# Patient Record
Sex: Female | Born: 1980
Health system: Midwestern US, Community
[De-identification: ages and names within clinical notes are randomized; demographics above are authoritative.]

## PROBLEM LIST (undated history)

## (undated) DIAGNOSIS — F32A Depression, unspecified: Secondary | ICD-10-CM

## (undated) DIAGNOSIS — G4730 Sleep apnea, unspecified: Secondary | ICD-10-CM

## (undated) DIAGNOSIS — F419 Anxiety disorder, unspecified: Secondary | ICD-10-CM

## (undated) DIAGNOSIS — F329 Major depressive disorder, single episode, unspecified: Secondary | ICD-10-CM

## (undated) DIAGNOSIS — G47411 Narcolepsy with cataplexy: Secondary | ICD-10-CM

## (undated) HISTORY — PX: BREAST SURGERY: SHX581

## (undated) HISTORY — PX: GASTRIC BYPASS: SHX52

## (undated) HISTORY — PX: OTHER SURGICAL HISTORY: SHX169

## (undated) HISTORY — PX: PILONIDAL CYST EXCISION: SHX744

## (undated) HISTORY — PX: ADENOIDECTOMY: SUR15

## (undated) HISTORY — PX: DILATION AND CURETTAGE, DIAGNOSTIC / THERAPEUTIC: SUR384

---

## 2001-04-09 ENCOUNTER — Encounter: Payer: Self-pay | Admitting: Obstetrics and Gynecology

## 2001-04-09 ENCOUNTER — Ambulatory Visit (HOSPITAL_COMMUNITY): Admission: RE | Admit: 2001-04-09 | Discharge: 2001-04-09 | Payer: Self-pay | Admitting: Internal Medicine

## 2002-06-25 ENCOUNTER — Other Ambulatory Visit: Admission: RE | Admit: 2002-06-25 | Discharge: 2002-06-25 | Payer: Self-pay | Admitting: Gynecology

## 2002-12-12 ENCOUNTER — Other Ambulatory Visit: Admission: RE | Admit: 2002-12-12 | Discharge: 2002-12-12 | Payer: Self-pay | Admitting: Gynecology

## 2003-04-17 ENCOUNTER — Other Ambulatory Visit: Admission: RE | Admit: 2003-04-17 | Discharge: 2003-04-17 | Payer: Self-pay | Admitting: Gynecology

## 2003-09-11 ENCOUNTER — Ambulatory Visit (HOSPITAL_COMMUNITY): Admission: RE | Admit: 2003-09-11 | Discharge: 2003-09-11 | Payer: Self-pay | Admitting: Family Medicine

## 2003-09-16 ENCOUNTER — Other Ambulatory Visit: Admission: RE | Admit: 2003-09-16 | Discharge: 2003-09-16 | Payer: Self-pay | Admitting: Gynecology

## 2004-02-03 ENCOUNTER — Other Ambulatory Visit: Admission: RE | Admit: 2004-02-03 | Discharge: 2004-02-03 | Payer: Self-pay | Admitting: Gynecology

## 2004-08-23 ENCOUNTER — Other Ambulatory Visit: Admission: RE | Admit: 2004-08-23 | Discharge: 2004-08-23 | Payer: Self-pay | Admitting: Gynecology

## 2005-01-16 ENCOUNTER — Ambulatory Visit (HOSPITAL_BASED_OUTPATIENT_CLINIC_OR_DEPARTMENT_OTHER): Admission: RE | Admit: 2005-01-16 | Discharge: 2005-01-16 | Payer: Self-pay | Admitting: Family Medicine

## 2005-01-23 ENCOUNTER — Ambulatory Visit: Payer: Self-pay | Admitting: Internal Medicine

## 2005-02-09 ENCOUNTER — Other Ambulatory Visit: Admission: RE | Admit: 2005-02-09 | Discharge: 2005-02-09 | Payer: Self-pay | Admitting: Gynecology

## 2006-02-13 ENCOUNTER — Other Ambulatory Visit: Admission: RE | Admit: 2006-02-13 | Discharge: 2006-02-13 | Payer: Self-pay | Admitting: Gynecology

## 2007-03-05 ENCOUNTER — Other Ambulatory Visit: Admission: RE | Admit: 2007-03-05 | Discharge: 2007-03-05 | Payer: Self-pay | Admitting: Gynecology

## 2007-06-21 ENCOUNTER — Ambulatory Visit (HOSPITAL_COMMUNITY): Admission: RE | Admit: 2007-06-21 | Discharge: 2007-06-21 | Payer: Self-pay | Admitting: Gynecology

## 2007-07-30 ENCOUNTER — Encounter (INDEPENDENT_AMBULATORY_CARE_PROVIDER_SITE_OTHER): Payer: Self-pay | Admitting: *Deleted

## 2007-07-30 ENCOUNTER — Ambulatory Visit (HOSPITAL_COMMUNITY): Admission: AD | Admit: 2007-07-30 | Discharge: 2007-07-30 | Payer: Self-pay | Admitting: *Deleted

## 2008-02-25 ENCOUNTER — Other Ambulatory Visit: Admission: RE | Admit: 2008-02-25 | Discharge: 2008-02-25 | Payer: Self-pay | Admitting: Gynecology

## 2008-05-22 ENCOUNTER — Emergency Department (HOSPITAL_COMMUNITY): Admission: EM | Admit: 2008-05-22 | Discharge: 2008-05-23 | Payer: Self-pay | Admitting: Emergency Medicine

## 2009-08-31 ENCOUNTER — Ambulatory Visit (HOSPITAL_BASED_OUTPATIENT_CLINIC_OR_DEPARTMENT_OTHER): Admission: RE | Admit: 2009-08-31 | Discharge: 2009-08-31 | Payer: Self-pay | Admitting: General Surgery

## 2010-02-12 ENCOUNTER — Emergency Department (HOSPITAL_COMMUNITY): Admission: EM | Admit: 2010-02-12 | Discharge: 2010-02-12 | Payer: Self-pay | Admitting: Emergency Medicine

## 2010-04-02 ENCOUNTER — Emergency Department (HOSPITAL_COMMUNITY): Admission: EM | Admit: 2010-04-02 | Discharge: 2010-04-02 | Payer: Self-pay | Admitting: Emergency Medicine

## 2010-04-22 ENCOUNTER — Ambulatory Visit: Payer: Self-pay | Admitting: Internal Medicine

## 2010-04-23 ENCOUNTER — Telehealth: Payer: Self-pay | Admitting: Internal Medicine

## 2010-06-03 ENCOUNTER — Ambulatory Visit: Admit: 2010-06-03 | Payer: Self-pay | Admitting: Internal Medicine

## 2010-06-15 NOTE — Progress Notes (Signed)
Summary: nos appt  Phone Note Call from Patient   Caller: [email or web address]  Call For: young Summary of Call: LMTCB x2 to rsc nos from 12/8. Initial call taken by: Darletta Moll,  April 23, 2010 2:59 PM

## 2010-06-16 ENCOUNTER — Other Ambulatory Visit: Payer: Self-pay | Admitting: Gynecology

## 2010-07-29 LAB — CULTURE, ROUTINE-ABSCESS

## 2010-08-03 LAB — WOUND CULTURE

## 2010-08-30 LAB — CULTURE, ROUTINE-ABSCESS

## 2010-08-30 LAB — URINE MICROSCOPIC-ADD ON

## 2010-08-30 LAB — URINALYSIS, ROUTINE W REFLEX MICROSCOPIC
Hgb urine dipstick: NEGATIVE
Leukocytes, UA: NEGATIVE
Protein, ur: 30 mg/dL — AB
Specific Gravity, Urine: 1.043 — ABNORMAL HIGH (ref 1.005–1.030)
Urobilinogen, UA: 0.2 mg/dL (ref 0.0–1.0)

## 2010-08-30 LAB — CBC
Platelets: 290 10*3/uL (ref 150–400)
RDW: 13.3 % (ref 11.5–15.5)
WBC: 10.9 10*3/uL — ABNORMAL HIGH (ref 4.0–10.5)

## 2010-08-30 LAB — POCT I-STAT, CHEM 8
BUN: 15 mg/dL (ref 6–23)
Creatinine, Ser: 0.9 mg/dL (ref 0.4–1.2)
Hemoglobin: 12.9 g/dL (ref 12.0–15.0)
Potassium: 3.4 mEq/L — ABNORMAL LOW (ref 3.5–5.1)
Sodium: 140 mEq/L (ref 135–145)
TCO2: 29 mmol/L (ref 0–100)

## 2010-08-30 LAB — DIFFERENTIAL
Basophils Absolute: 0 10*3/uL (ref 0.0–0.1)
Eosinophils Absolute: 0.2 10*3/uL (ref 0.0–0.7)
Lymphocytes Relative: 29 % (ref 12–46)
Lymphs Abs: 3.1 10*3/uL (ref 0.7–4.0)
Neutrophils Relative %: 63 % (ref 43–77)

## 2010-09-28 NOTE — Op Note (Signed)
Shari Kennedy, Shari Kennedy                ACCOUNT NO.:  [id number]   MEDICAL RECORD NO.:  [id number]          PATIENT TYPE:  MAT   LOCATION:  [facility]                          FACILITY:  [facility]   PHYSICIAN:  Almedia Balls. Fore, M.D.   DATE OF BIRTH:  1980/10/03   DATE OF PROCEDURE:  05/15/2008  DATE OF DISCHARGE:                               OPERATIVE REPORT   PREOPERATIVE DIAGNOSIS:  Status post therapeutic abortion, continued  bleeding, with retained products of conception.   POSTOPERATIVE DIAGNOSIS:  Status post therapeutic abortion, continued  bleeding, with retained products of conception,  pending pathology.   OPERATION:  Suction D&E and hysteroscopy.   ANESTHESIA:  General orotracheal plus 10 mL 1% lidocaine paracervical  block.   OPERATOR:  Almedia Balls. Randell Patient, M.D.   INDICATIONS FOR SURGERY:  The patient is a 30 year old who underwent a  therapeutic abortion in [place], [place] in December 2008.  She  has had persistent bleeding since that time and has been followed by Dr.  Chevis Pretty since February 2009.  She had retained products of conception  which did not respond to medical treatment.  She experienced heavy  bleeding beginning on July 30, 2007, and presented at the office, where  her hemoglobin was 11.3.  Because of the extreme amount of bleeding at  this point, it was felt that repeat D&E was indicated.  She was fully  counseled as to the nature of the procedure and the risks involved to  include risks of anesthesia, injury to uterus, tubes, ovaries, bowel,  bladder, blood vessels, ureters, postoperative hemorrhage, infection,  and recuperation.  She fully understands all these considerations and  has signed informed consent to proceed on July 30, 2007.   OPERATIVE FINDINGS:  On bimanual exam, the uterus was approximately 6-[redacted]  weeks gestational size.  There were no palpable adnexal masses.  On  hysteroscopy, the uterus sounded to 9.5 cm.  The endocervical canal was  clean.   Endometrial cavity had a mass of tissue in the upper left fundal  area.   PROCEDURE:  With the patient under general anesthesia, prepared and  draped in usual sterile fashion, a speculum was placed in the vagina.  A  solution of 1% lidocaine was injected at the 2, 4, 8, and 10 o'clock  positions for a total of 10 mL for paracervical block.  Single-tooth  tenaculum was placed at the 12 o'clock position, and the cervix was  dilated up to a #27 Pratt dilator.  A #9 curved suction Vacurette was  used for evacuation of the tissue mass within the uterus.  Hysteroscope  was re-employed to ensure that all tissue had been removed, and after  noting this was the case, a small sharp curette was used for gentle  curetting of the endometrium to ensure that all small amounts of tissue  had been removed.  Suction Vacurette was re-employed to ensure that all  tissue had been removed.  After noting that this was the case and that  sponge and instrument counts were correct and hemostasis was maintained,  the procedure was terminated.  ESTIMATED BLOOD LOSS:  100 mL.   The patient was taken to the recovery room in good condition.   FOLLOW-UP CARE:  She is to return the office in approximately 2 weeks  for followup and to call if heavy bleeding, pain, or unexplained fever  should ensue.  She was instructed to avoid any vaginal penetrance for 2  weeks.  She was given prescriptions for Darvocet-N 100 generic, #10 to  be taken 1/2-1 q.6 h. p.r.n. pain; ampicillin 500 mg, #24, to be taken 4  stat, 1 q.i.d.; and Methergine 0.2 mg, #8, to be taken 1 q.i.d.           ______________________________  Almedia Balls. Randell Patient, M.D.     SRF/MEDQ  D:  07/30/2007  T:  07/31/2007  Job:  [id number]   cc:   Leatha Gilding. Mezer, M.D.  Fax: [phone]

## 2010-10-01 NOTE — Procedures (Signed)
Shari Kennedy, Shari Kennedy                ACCOUNT NO.:  [id number]   MEDICAL RECORD NO.:  [id number]          PATIENT TYPE:  OUT   LOCATION:  SLEEP CENTER                 FACILITY:  [facility]   PHYSICIAN:  Clinton D. Maple Hudson, M.D. DATE OF BIRTH:  [birth year]   DATE OF STUDY:  01/16/2005                              NOCTURNAL POLYSOMNOGRAM   REFERRING PHYSICIAN:  Dr. Selinda Flavin.   DATE OF STUDY:  January 16, 2005.   INDICATION FOR STUDY:  Hypersomnia with sleep apnea. Epworth sleepiness  score of 16/24, BMI 31, weight 178 pounds.   SLEEP ARCHITECTURE:  Total sleep time 377 minutes with sleep efficiency 87%.  Stage I 13%, stage II 81%, stages III and IV absent, REM 6% of total sleep  time. Sleep latency 14 minutes, REM latency 396 minutes, awake after sleep  onset 44 minutes, arousal index increased at 50. Xanax was taken at 7 p.m.   RESPIRATORY DATA:  Apnea/hypopnea index (AHI, RDI) 1.6 per hour which is  within normal limits. This reflected the total of 9 hypopneas. Almost all  sleep and therefore all events was while on left side. REM HPI of 12.5.   OXYGEN DATA:  Mild snoring with oxygen desaturation to a nadir of 85%. Mean  oxygen saturation through the study was 97% on room air.   CARDIAC DATA:  Normal sinus rhythm.   MOVEMENT/PARASOMNIA:  A total of 236 limb jerks were recorded of which 43  were associated with arousal or awakening for periodic limb movement with  arousal index of 6.8 per hour which is increased.   IMPRESSION/RECOMMENDATIONS:  1.  She slept over 6 hours by EEG criteria but estimated her sleep time at 4      hours, suggesting sleep stage misperception.  2.  Occasional, insignificant, sleep disordered breathing events with the      apnea/hypopnea index of 1.6 per hour and normal oxygenation.  3.  Periodic limb movement with arousal, 6.8 per hour. This is enough to      consider specific therapy such as with clonazepam or Requip if      clinically  appropriate.      Clinton D. Maple Hudson, M.D.  Diplomate, Biomedical engineer of Sleep Medicine  Electronically Signed     CDY/MEDQ  D:  01/23/2005 09:14:31  T:  01/23/2005 22:29:16  Job:  [id number]

## 2010-11-09 ENCOUNTER — Ambulatory Visit (HOSPITAL_BASED_OUTPATIENT_CLINIC_OR_DEPARTMENT_OTHER)
Admission: RE | Admit: 2010-11-09 | Discharge: 2010-11-10 | Disposition: A | Discharge status: Home / Self Care | Payer: BC Managed Care – PPO | Source: Ambulatory Visit | Attending: Plastic Surgery | Admitting: Plastic Surgery

## 2010-11-09 ENCOUNTER — Other Ambulatory Visit: Payer: Self-pay | Admitting: Plastic Surgery

## 2010-11-09 DIAGNOSIS — N62 Hypertrophy of breast: Secondary | ICD-10-CM | POA: Insufficient documentation

## 2010-11-09 DIAGNOSIS — Z01812 Encounter for preprocedural laboratory examination: Secondary | ICD-10-CM | POA: Insufficient documentation

## 2010-11-09 LAB — POCT HEMOGLOBIN-HEMACUE
Hemoglobin: 11.2 g/dL — ABNORMAL LOW (ref 12.0–15.0)
Hemoglobin: 14.8 g/dL (ref 12.0–15.0)
Hemoglobin: 9.8 g/dL — ABNORMAL LOW (ref 12.0–15.0)

## 2011-02-07 LAB — ABO/RH: ABO/RH(D): O POS

## 2011-04-22 ENCOUNTER — Emergency Department (HOSPITAL_COMMUNITY): Payer: BC Managed Care – PPO

## 2011-04-22 ENCOUNTER — Emergency Department (HOSPITAL_COMMUNITY)
Admission: EM | Admit: 2011-04-22 | Discharge: 2011-04-22 | Disposition: A | Discharge status: Home / Self Care | Payer: BC Managed Care – PPO | Attending: Emergency Medicine | Admitting: Emergency Medicine

## 2011-04-22 ENCOUNTER — Encounter: Payer: Self-pay | Admitting: *Deleted

## 2011-04-22 DIAGNOSIS — N949 Unspecified condition associated with female genital organs and menstrual cycle: Secondary | ICD-10-CM | POA: Insufficient documentation

## 2011-04-22 DIAGNOSIS — I10 Essential (primary) hypertension: Secondary | ICD-10-CM | POA: Insufficient documentation

## 2011-04-22 DIAGNOSIS — F411 Generalized anxiety disorder: Secondary | ICD-10-CM | POA: Insufficient documentation

## 2011-04-22 DIAGNOSIS — G4730 Sleep apnea, unspecified: Secondary | ICD-10-CM | POA: Insufficient documentation

## 2011-04-22 DIAGNOSIS — R109 Unspecified abdominal pain: Secondary | ICD-10-CM | POA: Insufficient documentation

## 2011-04-22 DIAGNOSIS — F1910 Other psychoactive substance abuse, uncomplicated: Secondary | ICD-10-CM | POA: Insufficient documentation

## 2011-04-22 DIAGNOSIS — R51 Headache: Secondary | ICD-10-CM | POA: Insufficient documentation

## 2011-04-22 DIAGNOSIS — Y92410 Unspecified street and highway as the place of occurrence of the external cause: Secondary | ICD-10-CM | POA: Insufficient documentation

## 2011-04-22 DIAGNOSIS — M542 Cervicalgia: Secondary | ICD-10-CM | POA: Insufficient documentation

## 2011-04-22 DIAGNOSIS — M79609 Pain in unspecified limb: Secondary | ICD-10-CM | POA: Insufficient documentation

## 2011-04-22 DIAGNOSIS — R079 Chest pain, unspecified: Secondary | ICD-10-CM | POA: Insufficient documentation

## 2011-04-22 DIAGNOSIS — G47419 Narcolepsy without cataplexy: Secondary | ICD-10-CM | POA: Insufficient documentation

## 2011-04-22 HISTORY — DX: Sleep apnea, unspecified: G47.30

## 2011-04-22 HISTORY — DX: Anxiety disorder, unspecified: F41.9

## 2011-04-22 LAB — URINALYSIS, ROUTINE W REFLEX MICROSCOPIC
Bilirubin Urine: NEGATIVE
Glucose, UA: NEGATIVE mg/dL
Hgb urine dipstick: NEGATIVE
Leukocytes, UA: NEGATIVE
Nitrite: NEGATIVE
Protein, ur: 100 mg/dL — AB
Specific Gravity, Urine: >1.03 — ABNORMAL HIGH (ref 1.005–1.030)
Urobilinogen, UA: 0.2 mg/dL (ref 0.0–1.0)

## 2011-04-22 LAB — URINE MICROSCOPIC-ADD ON

## 2011-04-22 MED ORDER — MORPHINE SULFATE 2 MG/ML IJ SOLN
2.0000 mg | Freq: Once | INTRAMUSCULAR | Status: AC
  Administered 2011-04-22: 2 mg via INTRAVENOUS
  Filled 2011-04-22: qty 1

## 2011-04-22 MED ORDER — HYDROCODONE-ACETAMINOPHEN 5-325 MG PO TABS: 1.0000 | ORAL_TABLET | ORAL | Status: AC | PRN

## 2011-04-22 MED ORDER — SODIUM CHLORIDE 0.9 % IV SOLN: Freq: Once | INTRAVENOUS | Status: DC

## 2011-04-22 MED ORDER — ACETAMINOPHEN 325 MG PO TABS
650.0000 mg | ORAL_TABLET | Freq: Once | ORAL | Status: AC
  Administered 2011-04-22: 650 mg via ORAL
  Filled 2011-04-22: qty 2

## 2011-04-22 MED ORDER — MORPHINE SULFATE 4 MG/ML IJ SOLN
4.0000 mg | Freq: Once | INTRAMUSCULAR | Status: AC
  Administered 2011-04-22: 4 mg via INTRAVENOUS
  Filled 2011-04-22: qty 1

## 2011-04-22 MED ORDER — ONDANSETRON HCL 4 MG/2ML IJ SOLN
4.0000 mg | Freq: Once | INTRAMUSCULAR | Status: AC
  Administered 2011-04-22: 4 mg via INTRAVENOUS
  Filled 2011-04-22: qty 2

## 2011-04-22 MED ORDER — IBUPROFEN 800 MG PO TABS: 800.0000 mg | ORAL_TABLET | Freq: Three times a day (TID) | ORAL | Status: AC

## 2011-04-22 MED ORDER — IOHEXOL 300 MG/ML  SOLN
100.0000 mL | Freq: Once | INTRAMUSCULAR | Status: AC | PRN
  Administered 2011-04-22: 100 mL via INTRAVENOUS
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
## 2011-04-22 NOTE — ED Notes (Signed)
Patient cannot use the bathroom at this time.  Patient resting comfortably w/family at her side.

## 2011-05-05 ENCOUNTER — Other Ambulatory Visit: Payer: Self-pay | Admitting: Plastic Surgery

## 2011-05-05 DIAGNOSIS — N6310 Unspecified lump in the right breast, unspecified quadrant: Secondary | ICD-10-CM

## 2011-07-13 ENCOUNTER — Other Ambulatory Visit: Payer: Self-pay | Admitting: Gynecology

## 2011-07-25 ENCOUNTER — Encounter (HOSPITAL_BASED_OUTPATIENT_CLINIC_OR_DEPARTMENT_OTHER): Payer: Self-pay | Admitting: *Deleted

## 2011-07-25 NOTE — Pre-Procedure Instructions (Signed)
BRING ALL MEDS AND CPAP TO SURGERY

## 2011-07-28 ENCOUNTER — Ambulatory Visit (HOSPITAL_BASED_OUTPATIENT_CLINIC_OR_DEPARTMENT_OTHER): Payer: BC Managed Care – PPO | Admitting: Anesthesiology

## 2011-07-28 ENCOUNTER — Encounter (HOSPITAL_BASED_OUTPATIENT_CLINIC_OR_DEPARTMENT_OTHER): Payer: Self-pay | Admitting: Anesthesiology

## 2011-07-28 ENCOUNTER — Ambulatory Visit (HOSPITAL_BASED_OUTPATIENT_CLINIC_OR_DEPARTMENT_OTHER)
Admission: RE | Admit: 2011-07-28 | Discharge: 2011-07-29 | Disposition: A | Discharge status: Home / Self Care | Payer: BC Managed Care – PPO | Source: Ambulatory Visit | Attending: Plastic Surgery | Admitting: Plastic Surgery

## 2011-07-28 ENCOUNTER — Encounter (HOSPITAL_BASED_OUTPATIENT_CLINIC_OR_DEPARTMENT_OTHER)
Admission: RE | Disposition: A | Discharge status: Home / Self Care | Payer: Self-pay | Source: Ambulatory Visit | Attending: Plastic Surgery

## 2011-07-28 ENCOUNTER — Encounter (HOSPITAL_BASED_OUTPATIENT_CLINIC_OR_DEPARTMENT_OTHER): Payer: Self-pay | Admitting: *Deleted

## 2011-07-28 DIAGNOSIS — N62 Hypertrophy of breast: Secondary | ICD-10-CM | POA: Insufficient documentation

## 2011-07-28 DIAGNOSIS — N641 Fat necrosis of breast: Secondary | ICD-10-CM | POA: Insufficient documentation

## 2011-07-28 HISTORY — DX: Depression, unspecified: F32.A

## 2011-07-28 HISTORY — DX: Major depressive disorder, single episode, unspecified: F32.9

## 2011-07-28 HISTORY — PX: BREAST REDUCTION SURGERY: SHX8

## 2011-07-28 SURGERY — MAMMOPLASTY, REDUCTION
Anesthesia: General | Site: Breast | Laterality: Right | Wound class: Clean

## 2011-07-28 MED ORDER — DEXTROSE-NACL 5-0.45 % IV SOLN
INTRAVENOUS | Status: DC
  Administered 2011-07-28: 18:00:00 via INTRAVENOUS

## 2011-07-28 MED ORDER — SUCCINYLCHOLINE CHLORIDE 20 MG/ML IJ SOLN
INTRAMUSCULAR | Status: DC | PRN
  Administered 2011-07-28: 100 mg via INTRAVENOUS

## 2011-07-28 MED ORDER — PROPOFOL 10 MG/ML IV EMUL
INTRAVENOUS | Status: DC | PRN
  Administered 2011-07-28: 250 mg via INTRAVENOUS

## 2011-07-28 MED ORDER — MIDAZOLAM HCL 2 MG/2ML IJ SOLN: 0.5000 mg | INTRAMUSCULAR | Status: DC | PRN

## 2011-07-28 MED ORDER — FENTANYL CITRATE 0.05 MG/ML IJ SOLN
INTRAMUSCULAR | Status: DC | PRN
  Administered 2011-07-28: 25 ug via INTRAVENOUS
  Administered 2011-07-28: 50 ug via INTRAVENOUS
  Administered 2011-07-28 (×3): 25 ug via INTRAVENOUS
  Administered 2011-07-28: 50 ug via INTRAVENOUS
  Administered 2011-07-28: 25 ug via INTRAVENOUS

## 2011-07-28 MED ORDER — HYDROMORPHONE HCL 2 MG PO TABS
2.0000 mg | ORAL_TABLET | ORAL | Status: DC | PRN
  Administered 2011-07-28 – 2011-07-29 (×4): 4 mg via ORAL

## 2011-07-28 MED ORDER — CEFAZOLIN SODIUM 1-5 GM-% IV SOLN
1.0000 g | Freq: Three times a day (TID) | INTRAVENOUS | Status: DC
  Administered 2011-07-28 – 2011-07-29 (×2): 1 g via INTRAVENOUS

## 2011-07-28 MED ORDER — DROPERIDOL 2.5 MG/ML IJ SOLN
INTRAMUSCULAR | Status: DC | PRN
  Administered 2011-07-28: 0.625 mg via INTRAVENOUS

## 2011-07-28 MED ORDER — LACTATED RINGERS IV SOLN
INTRAVENOUS | Status: DC
  Administered 2011-07-28: 13:00:00 via INTRAVENOUS
  Administered 2011-07-28: 20 mL/h via INTRAVENOUS
  Administered 2011-07-28: 15:00:00 via INTRAVENOUS

## 2011-07-28 MED ORDER — SERTRALINE HCL 100 MG PO TABS
200.0000 mg | ORAL_TABLET | Freq: Every day | ORAL | Status: DC
  Administered 2011-07-28: 200 mg via ORAL

## 2011-07-28 MED ORDER — MORPHINE SULFATE 4 MG/ML IJ SOLN: 0.0500 mg/kg | INTRAMUSCULAR | Status: DC | PRN

## 2011-07-28 MED ORDER — MIDAZOLAM HCL 5 MG/5ML IJ SOLN
INTRAMUSCULAR | Status: DC | PRN
  Administered 2011-07-28: 2 mg via INTRAVENOUS

## 2011-07-28 MED ORDER — METOCLOPRAMIDE HCL 5 MG/ML IJ SOLN: 10.0000 mg | Freq: Once | INTRAMUSCULAR | Status: DC | PRN

## 2011-07-28 MED ORDER — MODAFINIL 200 MG PO TABS
200.0000 mg | ORAL_TABLET | Freq: Every day | ORAL | Status: DC
  Administered 2011-07-28: 200 mg via ORAL

## 2011-07-28 MED ORDER — HYDROMORPHONE HCL PF 1 MG/ML IJ SOLN
0.2500 mg | INTRAMUSCULAR | Status: DC | PRN
  Administered 2011-07-28 (×3): 0.25 mg via INTRAVENOUS

## 2011-07-28 MED ORDER — ACETAMINOPHEN 10 MG/ML IV SOLN
1000.0000 mg | Freq: Once | INTRAVENOUS | Status: AC
  Administered 2011-07-28: 1000 mg via INTRAVENOUS

## 2011-07-28 MED ORDER — LIDOCAINE HCL (CARDIAC) 20 MG/ML IV SOLN
INTRAVENOUS | Status: DC | PRN
  Administered 2011-07-28: 75 mg via INTRAVENOUS

## 2011-07-28 MED ORDER — DEXAMETHASONE SODIUM PHOSPHATE 4 MG/ML IJ SOLN
INTRAMUSCULAR | Status: DC | PRN
  Administered 2011-07-28: 10 mg via INTRAVENOUS

## 2011-07-28 MED ORDER — METHOCARBAMOL 500 MG PO TABS
500.0000 mg | ORAL_TABLET | Freq: Four times a day (QID) | ORAL | Status: DC
  Administered 2011-07-28 – 2011-07-29 (×3): 500 mg via ORAL

## 2011-07-28 SURGICAL SUPPLY — 60 items
BALL CTTN LRG ABS STRL LF (GAUZE/BANDAGES/DRESSINGS)
BANDAGE ELASTIC 6 VELCRO ST LF (GAUZE/BANDAGES/DRESSINGS) ×3 IMPLANT
BLADE HEX COATED 2.75 (ELECTRODE) ×2 IMPLANT
BLADE KNIFE PERSONA 10 (BLADE) ×1 IMPLANT
BLADE SURG 10 STRL SS (BLADE) ×4 IMPLANT
BLADE SURG 15 STRL LF DISP TIS (BLADE) ×1 IMPLANT
BLADE SURG 15 STRL SS (BLADE) ×2
CANISTER SUCTION 1200CC (MISCELLANEOUS) ×2 IMPLANT
CHLORAPREP W/TINT 26ML (MISCELLANEOUS) ×3 IMPLANT
CLOTH BEACON ORANGE TIMEOUT ST (SAFETY) ×2 IMPLANT
COTTONBALL LRG STERILE PKG (GAUZE/BANDAGES/DRESSINGS) IMPLANT
COVER MAYO STAND STRL (DRAPES) ×2 IMPLANT
COVER TABLE BACK 60X90 (DRAPES) ×2 IMPLANT
DRAIN CHANNEL 19F RND (DRAIN) IMPLANT
DRAIN PENROSE 1/2X12 LTX STRL (WOUND CARE) ×1 IMPLANT
DRAPE LAPAROSCOPIC ABDOMINAL (DRAPES) ×2 IMPLANT
DRSG PAD ABDOMINAL 8X10 ST (GAUZE/BANDAGES/DRESSINGS) ×5 IMPLANT
ELECT REM PT RETURN 9FT ADLT (ELECTROSURGICAL) ×2
ELECTRODE REM PT RTRN 9FT ADLT (ELECTROSURGICAL) ×1 IMPLANT
EVACUATOR SILICONE 100CC (DRAIN) IMPLANT
FILTER 7/8 IN (FILTER) ×2 IMPLANT
GAUZE SPONGE 4X4 12PLY STRL LF (GAUZE/BANDAGES/DRESSINGS) ×1 IMPLANT
GAUZE XEROFORM 5X9 LF (GAUZE/BANDAGES/DRESSINGS) ×3 IMPLANT
GLOVE BIO SURGEON STRL SZ7.5 (GLOVE) ×2 IMPLANT
GLOVE BIO SURGEON STRL SZ8 (GLOVE) IMPLANT
GLOVE BIOGEL PI IND STRL 8 (GLOVE) ×1 IMPLANT
GLOVE BIOGEL PI INDICATOR 8 (GLOVE) ×1
GLOVE ECLIPSE 6.5 STRL STRAW (GLOVE) ×1 IMPLANT
GLOVE INDICATOR 7.0 STRL GRN (GLOVE) ×1 IMPLANT
GLOVE SKINSENSE NS SZ7.0 (GLOVE) ×1
GLOVE SKINSENSE STRL SZ7.0 (GLOVE) IMPLANT
GOWN PREVENTION PLUS XLARGE (GOWN DISPOSABLE) ×3 IMPLANT
GOWN PREVENTION PLUS XXLARGE (GOWN DISPOSABLE) ×2 IMPLANT
NS IRRIG 1000ML POUR BTL (IV SOLUTION) ×1 IMPLANT
PACK BASIN DAY SURGERY FS (CUSTOM PROCEDURE TRAY) ×2 IMPLANT
PAD EYE OVAL STERILE LF (GAUZE/BANDAGES/DRESSINGS) IMPLANT
PIN SAFETY STERILE (MISCELLANEOUS) IMPLANT
SLEEVE SCD COMPRESS KNEE MED (MISCELLANEOUS) ×1 IMPLANT
SPECIMEN JAR LARGE (MISCELLANEOUS) ×1 IMPLANT
SPECIMEN JAR X LARGE (MISCELLANEOUS) ×2 IMPLANT
SPONGE GAUZE 4X4 12PLY (GAUZE/BANDAGES/DRESSINGS) ×2 IMPLANT
SPONGE LAP 18X18 X RAY DECT (DISPOSABLE) ×6 IMPLANT
STRIP CLOSURE SKIN 1/2X4 (GAUZE/BANDAGES/DRESSINGS) ×6 IMPLANT
SUT ETHILON 3 0 PS 1 (SUTURE) ×1 IMPLANT
SUT MNCRL AB 3-0 PS2 18 (SUTURE) ×12 IMPLANT
SUT MNCRL AB 4-0 PS2 18 (SUTURE) ×7 IMPLANT
SUT PROLENE 4 0 PS 2 18 (SUTURE) IMPLANT
SUT PROLENE 5 0 PS 2 (SUTURE) IMPLANT
SUT SILK 4 0 PS 2 (SUTURE) IMPLANT
SUT VIC AB 2-0 PS2 27 (SUTURE) IMPLANT
SUT VICRYL 3-0 CR8 SH (SUTURE) IMPLANT
SYR BULB IRRIGATION 50ML (SYRINGE) ×4 IMPLANT
TAPE CLOTH SURG 4X10 WHT LF (GAUZE/BANDAGES/DRESSINGS) ×1 IMPLANT
TOWEL OR 17X24 6PK STRL BLUE (TOWEL DISPOSABLE) ×5 IMPLANT
TRAY FOLEY CATH 14FR (SET/KITS/TRAYS/PACK) IMPLANT
TUBE CONNECTING 20X1/4 (TUBING) ×2 IMPLANT
UNDERPAD 30X30 INCONTINENT (UNDERPADS AND DIAPERS) ×4 IMPLANT
VAC PENCILS W/TUBING CLEAR (MISCELLANEOUS) ×2 IMPLANT
WATER STERILE IRR 1000ML POUR (IV SOLUTION) ×1 IMPLANT
YANKAUER SUCT BULB TIP NO VENT (SUCTIONS) ×2 IMPLANT

## 2011-07-28 NOTE — Anesthesia Postprocedure Evaluation (Signed)
Anesthesia Post Note  Patient: Shari Kennedy  Procedure(s) Performed: Procedure(s) (LRB): MAMMARY REDUCTION  (BREAST) (Right)  Anesthesia type: General  Patient location: PACU  Post pain: Pain level controlled  Post assessment: Patient's Cardiovascular Status Stable  Last Vitals:  Filed Vitals:   07/28/11 1700  BP: 119/65  Pulse: 78  Temp:   Resp: 17    Post vital signs: Reviewed and stable  Level of consciousness: alert  Complications: No apparent anesthesia complications

## 2011-07-28 NOTE — H&P (Signed)
Patient reexamined and reevaluated and there are no changes in history and physical.

## 2011-07-28 NOTE — Anesthesia Procedure Notes (Signed)
Procedure Name: Intubation Date/Time: 07/28/2011 2:25 PM Performed by: Burna Cash Pre-anesthesia Checklist: Patient identified, Emergency Drugs available, Suction available and Patient being monitored Patient Re-evaluated:Patient Re-evaluated prior to inductionOxygen Delivery Method: Circle System Utilized Preoxygenation: Pre-oxygenation with 100% oxygen Intubation Type: IV induction and Cricoid Pressure applied Ventilation: Mask ventilation without difficulty Laryngoscope Size: Mac and 3 Grade View: Grade I Tube type: Oral Tube size: 7.0 mm Number of attempts: 1 Airway Equipment and Method: stylet and oral airway Placement Confirmation: ETT inserted through vocal cords under direct vision,  positive ETCO2 and breath sounds checked- equal and bilateral Secured at: 23 cm Tube secured with: Tape Dental Injury: Teeth and Oropharynx as per pre-operative assessment

## 2011-07-28 NOTE — Brief Op Note (Signed)
07/28/2011  4:03 PM  PATIENT:  Shari Kennedy  31 y.o. female  PRE-OPERATIVE DIAGNOSIS:  right breast hypertrophy and mass  POST-OPERATIVE DIAGNOSIS:  right breast hypertrophy and mass  PROCEDURE:  Procedure(s) (LRB): MAMMARY REDUCTION (Right)  SURGEON:  Surgeon(s) and Role:    * Etter Sjogren, MD - Primary  PHYSICIAN ASSISTANT:   ASSISTANTS: none   ANESTHESIA:   general  EBL:  Total I/O In: 1800 [I.V.:1800] Out: 0   BLOOD ADMINISTERED:none  DRAINS: Penrose drain in the wound right breast   LOCAL MEDICATIONS USED:  NONE  SPECIMEN:  Source of Specimen:  right breast  DISPOSITION OF SPECIMEN:  PATHOLOGY  COUNTS:  YES  TOURNIQUET:  * No tourniquets in log *  DICTATION: .Other Dictation: Dictation Number [id number]  PLAN OF CARE: Admit for overnight observation  PATIENT DISPOSITION:  PACU - hemodynamically stable.   Delay start of Pharmacological VTE agent (>24hrs) due to surgical blood loss or risk of bleeding: yes

## 2011-07-28 NOTE — Transfer of Care (Signed)
Immediate Anesthesia Transfer of Care Note  Patient: Shari Kennedy  Procedure(s) Performed: Procedure(s) (LRB): MAMMARY REDUCTION BILATERAL (BREAST) (Right)  Patient Location: PACU  Anesthesia Type: General  Level of Consciousness: awake, alert  and oriented  Airway & Oxygen Therapy: Patient Spontanous Breathing and Patient connected to nasal cannula oxygen  Post-op Assessment: Report given to PACU RN and Post -op Vital signs reviewed and stable  Post vital signs: Reviewed and stable  Complications: No apparent anesthesia complications

## 2011-07-28 NOTE — Anesthesia Preprocedure Evaluation (Signed)
Anesthesia Evaluation  Patient identified by MRN, date of birth, ID band Patient awake    Reviewed: Allergy & Precautions, H&P , NPO status , Patient's Chart, lab work & pertinent test results, reviewed documented beta blocker date and time   Airway Mallampati: II TM Distance: >3 FB Neck ROM: full    Dental   Pulmonary sleep apnea and Continuous Positive Airway Pressure Ventilation ,          Cardiovascular negative cardio ROS      Neuro/Psych PSYCHIATRIC DISORDERS negative neurological ROS     GI/Hepatic negative GI ROS, Neg liver ROS,   Endo/Other  Morbid obesity  Renal/GU negative Renal ROS  negative genitourinary   Musculoskeletal   Abdominal   Peds  Hematology negative hematology ROS (+)   Anesthesia Other Findings See surgeon's H&P   Reproductive/Obstetrics negative OB ROS                           Anesthesia Physical Anesthesia Plan  ASA: III  Anesthesia Plan: General   Post-op Pain Management:    Induction: Intravenous  Airway Management Planned: Oral ETT  Additional Equipment:   Intra-op Plan:   Post-operative Plan: Extubation in OR  Informed Consent: I have reviewed the patients History and Physical, chart, labs and discussed the procedure including the risks, benefits and alternatives for the proposed anesthesia with the patient or authorized representative who has indicated his/her understanding and acceptance.     Plan Discussed with: CRNA and Surgeon  Anesthesia Plan Comments:         Anesthesia Quick Evaluation

## 2011-07-29 ENCOUNTER — Encounter (HOSPITAL_BASED_OUTPATIENT_CLINIC_OR_DEPARTMENT_OTHER): Payer: Self-pay | Admitting: Plastic Surgery

## 2011-07-29 NOTE — Discharge Instructions (Addendum)
No lifting for 6 weeks No vigorous activity for 6 weeks (including outdoor walks) No driving for 4 weeks OK to walk up stairs slowly Stay propped up Use incentive spirometer at home every hour while awake No shower Change dressing once tomorrow and again Sunday or Monday Take an over-the-counter Probiotic while on antibiotics Take an over-the-counter stool softener (such as Colace) while on pain medication

## 2011-07-29 NOTE — Discharge Summary (Signed)
Physician Discharge Summary  Patient ID: Shari Kennedy MRN: [id number] DOB/AGE: September 10, 1980 30 y.o.  Admit date: 07/28/2011 Discharge date: 07/29/2011  Admission Diagnoses:Large mass and excess tissue right breast  Discharge Diagnoses: Same Active Problems:  * No active hospital problems. *    Discharged Condition: good  Hospital Course: On the day of admission the patient was taken to surgery and had right breast reduction with removal of majority of fat necrotic area.. The patient tolerated the procedures well. Postoperatively, the nipple maintained excellent color and capillary refill. The patient was ambulatory and tolerating diet on the first postoperative day. She was ready for discharge. Penrose drain removed.  Treatments: antibiotics: Ancef, anticoagulation: none and surgery: Right breast reduction  Discharge Exam: Blood pressure 121/68, pulse 70, temperature 98.3 F (36.8 C), temperature source Oral, resp. rate 18, height 5\' 3"  (1.6 m), weight 245 lb (111.131 kg), last menstrual period 07/04/2011, SpO2 97.00%.  Operative sites: Chest soft. No hematoma or infection. Drain removed. Contour is excellent thus far. Nipple complex viable.  Disposition: 01-Home or Self Care   Medication List  As of 07/29/2011  8:38 AM   ASK your doctor about these medications         clonazePAM 0.5 MG tablet   Commonly known as: KLONOPIN   Take 0.5 mg by mouth 2 (two) times daily as needed. Anxiety      modafinil 200 MG tablet   Commonly known as: PROVIGIL   Take 200 mg by mouth daily.      sertraline 100 MG tablet   Commonly known as: ZOLOFT   Take 200 mg by mouth daily.             SignedRossie Muskrat 07/29/2011, 8:38 AM

## 2011-07-29 NOTE — Op Note (Addendum)
NAMEALAURA, SCHIPPERS                ACCOUNT NO.:  [id number]  MEDICAL RECORD NO.:  [id number]  LOCATION:  APA11                        FACILITY:  [facility]  PHYSICIAN:  Etter Sjogren, M.D.     DATE OF BIRTH:  [birth year]  DATE OF PROCEDURE:  07/28/2011 DATE OF DISCHARGE:  04/22/2011                              OPERATIVE REPORT   PREOPERATIVE DIAGNOSIS:  Very large mass, right breast superior, medial, lateral.  POSTOPERATIVE DIAGNOSES:  Very large mass, right breast superior, medial, lateral, probable fat necrosis.  PROCEDURE PERFORMED:  Right breast reduction, removal of significant portion of right breast.  SURGEON:  Etter Sjogren, MD  ANESTHESIA:  General.  ESTIMATED BLOOD LOSS:  80 mL.  DRAINS:  One Penrose.  CLINICAL NOTE:  A 31 year old woman had bilateral breast reduction over a year ago elsewhere.  She developed some significant pain and firmness and tenderness in the right breast.  The other surgeon deferred.  She sought treatment at our office on a referral from her OB/GYN doctor. The feeling was that this probably represented a very large area of fat necrosis that is significant.  Some of it was behind the nipple-areolar complex.  She understood that we could not remove all of this.  We would remove as much as could be done and try to protect the nipple-areolar complex, which already apparently struggled from the first surgery in terms of survival because of some areas of hypopigmentation.  She understood there also would be some contour deformity and the right breast would be smaller on the left and this might be very bothersome to her and she might require further reconstructive surgeries at a later time.  I discussed all this very carefully on several occasions.  She wished to proceed with surgery.  The procedure risks plus complications also included but not limited to, bleeding, infection, anesthesia complications, healing problems, scarring, loss of sensation,  loss of sensation in nipple, contour deformities, asymmetry, disappointment, vascular compromise to the nipple-areolar complex, and changes in color of the nipple-areolar complex.  She also understood other risks to include anesthesia complications as well as pulmonary embolism and wished to proceed.  DESCRIPTION OF PROCEDURE:  The patient was marked in the holding area and taken to the operating room and placed supine.  After successful general anesthesia, she was prepped with ChloraPrep and after waiting full 3 minutes for drying, she was draped with sterile drapes.  The periareolar incision was then made, medial and superolateral and extended for a very short distance inferior, beveling outward from the nipple-areolar complex in order to preserve blood flow because this was an inferior pedicle that had been used previously by the other surgeon. The dissection was continued superiorly as well as somewhat medial and lateral superior.  The exposed area of fat necrosis was resected and removed as a specimen.  Care was taken to remain out from under the nipple- areolar complex proper.  Thorough irrigation with saline.  Hemostasis was achieved judiciously using a Bovie in order to avoid further fat necrosis.  After thorough irrigation and hemostasis had been achieved, a half-inch Penrose drain was positioned, brought out through the wound and secured with 3-0 nylon suture.  The wound closure with 3-0 Monocryl interrupted inverted deep dermal sutures, running 4-0 Monocryl subcuticular suture.  Steri-Strips and fluff dressing in order to capture fluid that we drained from the Penrose.  ABDs and circumferential Ace wrap applied and she was transferred to recovery in stable, having tolerated the procedure well.  DISPOSITION:  She will be observed in our CCU overnight.     Etter Sjogren, M.D.     DB/MEDQ  D:  07/28/2011  T:  07/29/2011  Job:  [id number]

## 2011-09-13 ENCOUNTER — Encounter (HOSPITAL_COMMUNITY): Payer: Self-pay

## 2011-09-13 ENCOUNTER — Emergency Department (HOSPITAL_COMMUNITY)
Admission: EM | Admit: 2011-09-13 | Discharge: 2011-09-13 | Disposition: A | Discharge status: Home / Self Care | Payer: BC Managed Care – PPO | Attending: Emergency Medicine | Admitting: Emergency Medicine

## 2011-09-13 DIAGNOSIS — N390 Urinary tract infection, site not specified: Secondary | ICD-10-CM | POA: Insufficient documentation

## 2011-09-13 DIAGNOSIS — R197 Diarrhea, unspecified: Secondary | ICD-10-CM | POA: Insufficient documentation

## 2011-09-13 DIAGNOSIS — J039 Acute tonsillitis, unspecified: Secondary | ICD-10-CM | POA: Insufficient documentation

## 2011-09-13 DIAGNOSIS — R070 Pain in throat: Secondary | ICD-10-CM | POA: Insufficient documentation

## 2011-09-13 DIAGNOSIS — F341 Dysthymic disorder: Secondary | ICD-10-CM | POA: Insufficient documentation

## 2011-09-13 DIAGNOSIS — R509 Fever, unspecified: Secondary | ICD-10-CM | POA: Insufficient documentation

## 2011-09-13 LAB — URINE MICROSCOPIC-ADD ON

## 2011-09-13 LAB — URINALYSIS, ROUTINE W REFLEX MICROSCOPIC
Bilirubin Urine: NEGATIVE
Leukocytes, UA: NEGATIVE
Nitrite: NEGATIVE
Specific Gravity, Urine: 1.02 (ref 1.005–1.030)
Urobilinogen, UA: 0.2 mg/dL (ref 0.0–1.0)
pH: 7.5 (ref 5.0–8.0)

## 2011-09-13 LAB — COMPREHENSIVE METABOLIC PANEL
AST: 20 U/L (ref 0–37)
CO2: 24 mEq/L (ref 19–32)
Calcium: 9.4 mg/dL (ref 8.4–10.5)
Creatinine, Ser: 0.62 mg/dL (ref 0.50–1.10)
GFR calc Af Amer: >90 mL/min (ref >90)
GFR calc non Af Amer: >90 mL/min (ref >90)
Total Protein: 7.6 g/dL (ref 6.0–8.3)

## 2011-09-13 LAB — DIFFERENTIAL
Basophils Absolute: 0 10*3/uL (ref 0.0–0.1)
Basophils Relative: 0 % (ref 0–1)
Lymphocytes Relative: 8 % — ABNORMAL LOW (ref 12–46)
Monocytes Absolute: 1.2 10*3/uL — ABNORMAL HIGH (ref 0.1–1.0)
Neutro Abs: 14.5 10*3/uL — ABNORMAL HIGH (ref 1.7–7.7)
Neutrophils Relative %: 85 % — ABNORMAL HIGH (ref 43–77)

## 2011-09-13 LAB — CBC
MCHC: 32 g/dL (ref 30.0–36.0)
Platelets: 291 10*3/uL (ref 150–400)
RDW: 15.2 % (ref 11.5–15.5)
WBC: 17.2 10*3/uL — ABNORMAL HIGH (ref 4.0–10.5)

## 2011-09-13 LAB — MONONUCLEOSIS SCREEN: Mono Screen: NEGATIVE

## 2011-09-13 MED ORDER — SODIUM CHLORIDE 0.9 % IV BOLUS (SEPSIS)
1000.0000 mL | Freq: Once | INTRAVENOUS | Status: AC
  Administered 2011-09-13: 1000 mL via INTRAVENOUS

## 2011-09-13 MED ORDER — LOPERAMIDE HCL 2 MG PO CAPS
4.0000 mg | ORAL_CAPSULE | Freq: Once | ORAL | Status: AC
  Administered 2011-09-13: 4 mg via ORAL
  Filled 2011-09-13: qty 2

## 2011-09-13 MED ORDER — ACETAMINOPHEN 500 MG PO TABS
ORAL_TABLET | ORAL | Status: AC
  Administered 2011-09-13: 16:00:00
  Filled 2011-09-13: qty 2

## 2011-09-13 MED ORDER — DEXAMETHASONE SODIUM PHOSPHATE 10 MG/ML IJ SOLN
10.0000 mg | INTRAMUSCULAR | Status: AC
  Administered 2011-09-13: 10 mg via INTRAVENOUS
  Filled 2011-09-13: qty 1

## 2011-09-13 NOTE — ED Notes (Signed)
Sick for several days w/ sore throat, fever and diarrhea. Was seen by pmd yesterday and treated her for uti.  Temp 104 a home

## 2011-09-13 NOTE — ED Provider Notes (Signed)
History   This chart was scribed for Shari Booze, MD by Sofie Rower. The patient was seen in room APA01/APA01 and the patient's care was started at 1:58 PM     CSN: [id number]  Arrival date & time 09/13/11  1332   First MD Initiated Contact with Patient 09/13/11 1355      Chief Complaint  Patient presents with  . Fever  . Sore Throat  . Diarrhea    (Consider location/radiation/quality/duration/timing/severity/associated sxs/prior treatment) HPI  Shari Kennedy is a 31 y.o. female who presents to the Emergency Department complaining of moderate, episodic fever (100.4) onset two days ago with associated symptoms of diarrhea, and sore throat. The pt states "the pain is rated at a 8/10 at present." Pt informs the EDP "I feel like there is a pressure when I go to the bathroom." Modifying factors include tylenol and cold shower which provides moderate relief of the symptoms. Pt has a hx of Urinary tract infection where which she was diagnosed yesterday.   Pt denies nausea, vomiting.  PCP is Dr. Kathe Mariner at [facility].   Past Medical History  Diagnosis Date  . Narcolepsy   . Anxiety   . Sleep apnea     CPAP EVERY NIGHT  . Depression     Past Surgical History  Procedure Date  . Breast surgery     reduction  . Pilonidal cyst excision   . Adenoidectomy   . Dilation and curettage, diagnostic / therapeutic   . Breast reduction surgery 07/28/2011    Procedure: MAMMARY REDUCTION  (BREAST);  Surgeon: Etter Sjogren, MD;  Location: [facility];  Service: Plastics;  Laterality: Right;  Right Breast Reduction      History  Substance Use Topics  . Smoking status: Former Smoker -- 0.5 packs/day    Quit date: 05/27/2011  . Smokeless tobacco: Never Used  . Alcohol Use: No    OB History    Grav Para Term Preterm Abortions TAB SAB Ect Mult Living                  Review of Systems  All other systems reviewed and are negative.    10 Systems reviewed and all are  negative for acute change except as noted in the HPI.    Allergies  Review of patient's allergies indicates no known allergies.  Home Medications   Current Outpatient Rx  Name Route Sig Dispense Refill  . CLONAZEPAM 0.5 MG PO TABS Oral Take 0.5 mg by mouth 2 (two) times daily as needed. Anxiety    . MODAFINIL 200 MG PO TABS Oral Take 200 mg by mouth daily.     . SERTRALINE HCL 100 MG PO TABS Oral Take 200 mg by mouth daily.       BP 127/74  Pulse 117  Temp(Src) 102.9 F (39.4 C) (Oral)  Resp 20  Ht 5\' 3"  (1.6 m)  Wt 247 lb (112.038 kg)  BMI 43.75 kg/m2  SpO2 96%  LMP 08/31/2011  Physical Exam  Nursing note and vitals reviewed. Constitutional: She is oriented to person, place, and time. She appears well-developed and well-nourished.  HENT:  Head: Normocephalic and atraumatic.  Nose: Nose normal.  Mouth/Throat: Oropharyngeal exudate and posterior oropharyngeal erythema present.  Eyes: Conjunctivae and EOM are normal.  Neck: Normal range of motion.  Cardiovascular: Normal rate, regular rhythm and normal heart sounds.  Exam reveals no gallop and no friction rub.   No murmur heard. Pulmonary/Chest: Effort normal and  breath sounds normal. She has no wheezes.  Abdominal: Soft.       Bowel sounds decreased.   Lymphadenopathy:    She has no cervical adenopathy.  Neurological: She is alert and oriented to person, place, and time.  Skin: Skin is warm and dry.  Psychiatric: She has a normal mood and affect. Her behavior is normal.    ED Course  Procedures (including critical care time)  DIAGNOSTIC STUDIES: Oxygen Saturation is 96% on room air, adequate by my interpretation.    COORDINATION OF CARE:     Results for orders placed during the hospital encounter of 09/13/11  CBC      Component Value Range   WBC 17.2 (*) 4.0 - 10.5 (K/uL)   RBC 4.35  3.87 - 5.11 (MIL/uL)   Hemoglobin 11.3 (*) 12.0 - 15.0 (g/dL)   HCT 76.7 (*) 34.1 - 46.0 (%)   MCV 81.1  78.0 - 100.0  (fL)   MCH 26.0  26.0 - 34.0 (pg)   MCHC 32.0  30.0 - 36.0 (g/dL)   RDW 93.7  90.2 - 40.9 (%)   Platelets 291  150 - 400 (K/uL)  DIFFERENTIAL      Component Value Range   Neutrophils Relative 85 (*) 43 - 77 (%)   Neutro Abs 14.5 (*) 1.7 - 7.7 (K/uL)   Lymphocytes Relative 8 (*) 12 - 46 (%)   Lymphs Abs 1.4  0.7 - 4.0 (K/uL)   Monocytes Relative 7  3 - 12 (%)   Monocytes Absolute 1.2 (*) 0.1 - 1.0 (K/uL)   Eosinophils Relative 0  0 - 5 (%)   Eosinophils Absolute 0.0  0.0 - 0.7 (K/uL)   Basophils Relative 0  0 - 1 (%)   Basophils Absolute 0.0  0.0 - 0.1 (K/uL)  URINALYSIS, ROUTINE W REFLEX MICROSCOPIC      Component Value Range   Color, Urine YELLOW  YELLOW    APPearance CLEAR  CLEAR    Specific Gravity, Urine 1.020  1.005 - 1.030    pH 7.5  5.0 - 8.0    Glucose, UA NEGATIVE  NEGATIVE (mg/dL)   Hgb urine dipstick MODERATE (*) NEGATIVE    Bilirubin Urine NEGATIVE  NEGATIVE    Ketones, ur NEGATIVE  NEGATIVE (mg/dL)   Protein, ur 735 (*) NEGATIVE (mg/dL)   Urobilinogen, UA 0.2  0.0 - 1.0 (mg/dL)   Nitrite NEGATIVE  NEGATIVE    Leukocytes, UA NEGATIVE  NEGATIVE   PREGNANCY, URINE      Component Value Range   Preg Test, Ur NEGATIVE  NEGATIVE   MONONUCLEOSIS SCREEN      Component Value Range   Mono Screen NEGATIVE  NEGATIVE   COMPREHENSIVE METABOLIC PANEL      Component Value Range   Sodium 136  135 - 145 (mEq/L)   Potassium 3.7  3.5 - 5.1 (mEq/L)   Chloride 99  96 - 112 (mEq/L)   CO2 24  19 - 32 (mEq/L)   Glucose, Bld 124 (*) 70 - 99 (mg/dL)   BUN 9  6 - 23 (mg/dL)   Creatinine, Ser 3.29  0.50 - 1.10 (mg/dL)   Calcium 9.4  8.4 - 92.4 (mg/dL)   Total Protein 7.6  6.0 - 8.3 (g/dL)   Albumin 3.5  3.5 - 5.2 (g/dL)   AST 20  0 - 37 (U/L)   ALT 25  0 - 35 (U/L)   Alkaline Phosphatase 72  39 - 117 (U/L)   Total Bilirubin  0.2 (*) 0.3 - 1.2 (mg/dL)   GFR calc non Af Amer >90  >90 (mL/min)   GFR calc Af Amer >90  >90 (mL/min)  URINE MICROSCOPIC-ADD ON      Component Value Range    Squamous Epithelial / LPF MANY (*) RARE    WBC, UA 3-6  <3 (WBC/hpf)   RBC / HPF 3-6  <3 (RBC/hpf)   Bacteria, UA FEW (*) RARE    She feels significantly better after IV fluids and IV dexamethasone. Urinalysis shows no evidence of residual urinary tract infection. Patient is advised of her laboratory workup findings and instructed to continue symptomatic treatment at home. She is advised that the steroids she got today should help her throat feel better tonight and tomorrow.  1. Tonsillitis   2. Urinary tract infection     2:04PM- EDP at bedside discusses treatment plan.   MDM  Tonsillitis which apparently is not streptococcal since strep infection should have shown improvement after her Rocephin and Keflex. Monoscreen will be sent and she will be given steroids for symptomatic relief and she will be given IV fluids for hydration purposes.      I personally performed the services described in this documentation, which was scribed in my presence. The recorded information has been reviewed and considered.      Shari Booze, MD 09/13/11 1540

## 2011-09-13 NOTE — Discharge Instructions (Signed)
Continue taking her Keflex until it is completely gone. Return to the emergency department if he developed difficulty swallowing or difficulty breathing. Otherwise, followup with your primary care doctor.  Viral Pharyngitis Viral pharyngitis is a viral infection that produces redness, pain, and swelling (inflammation) of the throat. It can spread from person to person (contagious). CAUSES Viral pharyngitis is caused by inhaling a large amount of certain germs called viruses. Many different viruses cause viral pharyngitis. SYMPTOMS Symptoms of viral pharyngitis include:  Sore throat.   Tiredness.   Stuffy nose.   Low-grade fever.   Congestion.   Cough.  TREATMENT Treatment includes rest, drinking plenty of fluids, and the use of over-the-counter medication (approved by your caregiver). HOME CARE INSTRUCTIONS   Drink enough fluids to keep your urine clear or pale yellow.   Eat soft, cold foods such as ice cream, frozen ice pops, or gelatin dessert.   Gargle with warm salt water (1 tsp salt per 1 qt of water).   If over age 50, throat lozenges may be used safely.   Only take over-the-counter or prescription medicines for pain, discomfort, or fever as directed by your caregiver. Do not take aspirin.  To help prevent spreading viral pharyngitis to others, avoid:  Mouth-to-mouth contact with others.   Sharing utensils for eating and drinking.   Coughing around others.  SEEK MEDICAL CARE IF:   You are better in a few days, then become worse.   You have a fever or pain not helped by pain medicines.   There are any other changes that concern you.  Document Released: 02/09/2005 Document Revised: 04/21/2011 Document Reviewed: 07/08/2010 [facility] Patient Information 2012 [place], [place].  Urinary Tract Infection Infections of the urinary tract can start in several places. A bladder infection (cystitis), a kidney infection (pyelonephritis), and a prostate infection (prostatitis)  are different types of urinary tract infections (UTIs). They usually get better if treated with medicines (antibiotics) that kill germs. Take all the medicine until it is gone. You or your child may feel better in a few days, but TAKE ALL MEDICINE or the infection may not respond and may become more difficult to treat. HOME CARE INSTRUCTIONS   Drink enough water and fluids to keep the urine clear or pale yellow. Cranberry juice is especially recommended, in addition to large amounts of water.   Avoid caffeine, tea, and carbonated beverages. They tend to irritate the bladder.   Alcohol may irritate the prostate.   Only take over-the-counter or prescription medicines for pain, discomfort, or fever as directed by your caregiver.  To prevent further infections:  Empty the bladder often. Avoid holding urine for long periods of time.   After a bowel movement, women should cleanse from front to back. Use each tissue only once.   Empty the bladder before and after sexual intercourse.  FINDING OUT THE RESULTS OF YOUR TEST Not all test results are available during your visit. If your or your child's test results are not back during the visit, make an appointment with your caregiver to find out the results. Do not assume everything is normal if you have not heard from your caregiver or the medical facility. It is important for you to follow up on all test results. SEEK MEDICAL CARE IF:   There is back pain.   Your baby is older than 3 months with a rectal temperature of 100.5 F (38.1 C) or higher for more than 1 day.   Your or your child's problems (symptoms)  are no better in 3 days. Return sooner if you or your child is getting worse.  SEEK IMMEDIATE MEDICAL CARE IF:   There is severe back pain or lower abdominal pain.   You or your child develops chills.   You have a fever.   Your baby is older than 3 months with a rectal temperature of 102 F (38.9 C) or higher.   Your baby is 68 months  old or younger with a rectal temperature of 100.4 F (38 C) or higher.   There is nausea or vomiting.   There is continued burning or discomfort with urination.  MAKE SURE YOU:   Understand these instructions.   Will watch your condition.   Will get help right away if you are not doing well or get worse.  Document Released: 02/09/2005 Document Revised: 04/21/2011 Document Reviewed: 09/14/2006 [facility] Patient Information 2012 [place], [place].
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
## 2013-10-31 ENCOUNTER — Ambulatory Visit (INDEPENDENT_AMBULATORY_CARE_PROVIDER_SITE_OTHER): Payer: No Typology Code available for payment source | Admitting: Psychology

## 2013-10-31 DIAGNOSIS — F3289 Other specified depressive episodes: Secondary | ICD-10-CM

## 2013-10-31 DIAGNOSIS — F411 Generalized anxiety disorder: Secondary | ICD-10-CM

## 2013-10-31 DIAGNOSIS — F329 Major depressive disorder, single episode, unspecified: Secondary | ICD-10-CM

## 2013-10-31 DIAGNOSIS — G47419 Narcolepsy without cataplexy: Secondary | ICD-10-CM

## 2013-11-21 ENCOUNTER — Encounter (HOSPITAL_COMMUNITY): Payer: Self-pay | Admitting: Dietician

## 2013-11-21 NOTE — Progress Notes (Signed)
Pt was a no-show for appointment scheduled for 11/21/2013 at 1730. 

## 2013-12-05 ENCOUNTER — Ambulatory Visit (HOSPITAL_COMMUNITY): Payer: Self-pay | Admitting: Psychology

## 2013-12-30 ENCOUNTER — Encounter (HOSPITAL_COMMUNITY): Payer: Self-pay | Admitting: Psychology

## 2013-12-30 ENCOUNTER — Ambulatory Visit (HOSPITAL_COMMUNITY): Payer: Self-pay | Admitting: Psychology

## 2014-01-16 ENCOUNTER — Encounter (HOSPITAL_COMMUNITY): Payer: Self-pay | Admitting: Psychology
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
## 2014-02-06 ENCOUNTER — Ambulatory Visit (INDEPENDENT_AMBULATORY_CARE_PROVIDER_SITE_OTHER): Payer: No Typology Code available for payment source | Admitting: Psychology

## 2014-02-06 DIAGNOSIS — F3289 Other specified depressive episodes: Secondary | ICD-10-CM

## 2014-02-06 DIAGNOSIS — F329 Major depressive disorder, single episode, unspecified: Secondary | ICD-10-CM

## 2014-02-06 DIAGNOSIS — F411 Generalized anxiety disorder: Secondary | ICD-10-CM

## 2014-02-06 DIAGNOSIS — G47419 Narcolepsy without cataplexy: Secondary | ICD-10-CM

## 2014-02-07 ENCOUNTER — Encounter (HOSPITAL_COMMUNITY): Payer: Self-pay | Admitting: Psychology
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
## 2014-02-07 NOTE — Progress Notes (Signed)
PROGRESS NOTE  Patient:  Shari Kennedy   DOB: [birth year]  MR Number: [id number]  Location: BEHAVIORAL [facility] [address] [place] [address] Dept: [phone]  Start: 9 AM End: 10 AM  Provider/Observer:     Hershal Coria PSYD  Chief Complaint:      Chief Complaint  Patient presents with  . Anxiety  . Depression  . Other    Narcolepsy/sleep disturbance    Reason For Service:     The patient was referred to Dr. Andrey Campanile was admitted to the anxiety, depression and coping with the new narcolepsy. He has also been diagnosed with sleep apnea and uses a CPAP machine at night. The patient also works third shift. She describes significant anxiety resulting in sweating, worrying about others and constantly intrusive thoughts around financial situations. She reports that when she is not working or worrying that she will sleep too much and has no social life. The patient reports that she has significant anxiety in social situations and is unable to go to training at work because of anxiety. She reports that she's had these symptoms for at least the past 10 years. She reports she quit taking anxiety medication January 2015 and that her anxiety has gotten much worse.    Interventions Strategy:  Cognitive/behavioral psychotherapeutic interventions  Participation Level:   Active  Participation Quality:  Appropriate      Behavioral Observation:  Well Groomed, Drowsy, and Appropriate.   Current Psychosocial Factors: The patient reports that she is continuing to struggle with her sleep patterns. The patient reports that she has continued with issues associated with her anxiety and depression. However, she reports that she has been working on some of the therapeutic interventions we have begun to develop. She reports that some of the basic behavioral changes including getting outside more at particular times  during the day when she can and working on trying to adjust aspects of her sleep and ongoing  Content of Session:   Reviewed her symptoms and continue to work on therapeutic interventions for issues related to depression, anxiety, and are exacerbated by sleep disturbance including narcolepsy  Current Status:   The patient reports continued sleep disturbance and anxiety/depression.  Patient Progress:   Patient progress continues to be moderate in nature. As on the second time I saw her the patient has missed 2 appointments Dr. Derrek Gu her appointment times.  Target Goals:   Target goals include reducing intensity, severity, and duration of anxiety and depressive symptoms including feelings of helplessness and hopelessness, isolation, and reduced social interaction. There is also stated goal of trying to improve issues associated with her sleep disorder.  Last Reviewed:   9/24th 2015  Goals Addressed Today:    Goals addressed today has to do with issues related to her disturbance in sleep pattern that are exacerbated by her narcolepsy and working on third shift.  Impression/Diagnosis:  The patient is long history of significant anxiety and depression but significant sleep disturbance. The patient works third shift and also has to do with what has been described as narcolepsy. When she is at rest he will often sleep and will sleep for extended periods of time but other times have difficulty sleeping. The patient also has been diagnosed with sleep apnea and uses a CPAP machine.      Diagnosis:    Axis I: Generalized anxiety disorder  Depressive disorder, not elsewhere classified  Narcolepsy  Axis II: No diagnosis      RODENBOUGH,JOHN R, PsyD 02/07/2014

## 2014-02-27 ENCOUNTER — Ambulatory Visit (HOSPITAL_COMMUNITY): Payer: Self-pay | Admitting: Psychology

## 2014-04-07 ENCOUNTER — Ambulatory Visit (INDEPENDENT_AMBULATORY_CARE_PROVIDER_SITE_OTHER): Payer: No Typology Code available for payment source | Admitting: Psychology

## 2014-04-07 DIAGNOSIS — F4001 Agoraphobia with panic disorder: Secondary | ICD-10-CM

## 2014-04-07 DIAGNOSIS — G47419 Narcolepsy without cataplexy: Secondary | ICD-10-CM

## 2014-04-07 DIAGNOSIS — F411 Generalized anxiety disorder: Secondary | ICD-10-CM

## 2014-04-08 ENCOUNTER — Encounter (HOSPITAL_COMMUNITY): Payer: Self-pay | Admitting: Psychology

## 2014-04-08 NOTE — Progress Notes (Signed)
PROGRESS NOTE  Patient:  Shari Kennedy   DOB: 1980-07-11  MR Number: [id number]  [facility] [address] [place] [address] Dept: [phone]  Start: 9 AM End: 10 AM  Provider/Observer:     Hershal CoriaJohn R Jayquon Theiler PSYD  Chief Complaint:      Chief Complaint  Patient presents with  . Anxiety  . Panic Attack    Reason For Service:     The patient was referred to Dr. Andrey CampanileWilson was admitted to the anxiety, depression and coping with the new narcolepsy. He has also been diagnosed with sleep apnea and uses a CPAP machine at night. The patient also works third shift. She describes significant anxiety resulting in sweating, worrying about others and constantly intrusive thoughts around financial situations. She reports that when she is not working or worrying that she will sleep too much and has no social life. The patient reports that she has significant anxiety in social situations and is unable to go to training at work because of anxiety. She reports that she's had these symptoms for at least the past 10 years. She reports she quit taking anxiety medication January 2015 and that her anxiety has gotten much worse.    Interventions Strategy:  Cognitive/behavioral psychotherapeutic interventions  Participation Level:   Active  Participation Quality:  Appropriate      Behavioral Observation:  Well Groomed, Drowsy, and Appropriate.   Current Psychosocial Factors: The patient reports that she is continuing to struggle with her sleep patterns. The patient reports that she has continued with issues associated with her anxiety and depression. However, she reports that she has been working on some of the therapeutic interventions we have begun to develop. She reports that some of the basic behavioral changes including getting outside more at particular times during the day when she can and working  on trying to adjust aspects of her sleep and ongoing  Content of Session:   Reviewed her symptoms and continue to work on therapeutic interventions for issues related to depression, anxiety, and are exacerbated by sleep disturbance including narcolepsy  Current Status:   The patient reports continued sleep disturbance and anxiety/depression.  Patient Progress:   Patient progress continues to be moderate in nature. As on the second time I saw her the patient has missed 2 appointments Dr. Derrek Guoversleeping her appointment times.  Target Goals:   Target goals include reducing intensity, severity, and duration of anxiety and depressive symptoms including feelings of helplessness and hopelessness, isolation, and reduced social interaction. There is also stated goal of trying to improve issues associated with her sleep disorder.  Last Reviewed:   04/07/2014  Goals Addressed Today:    Goals addressed today has to do with issues related to her disturbance in sleep pattern that are exacerbated by her narcolepsy and working on third shift.  Impression/Diagnosis:  The patient is long history of significant anxiety and depression but significant sleep disturbance. The patient works third shift and also has to do with what has been described as narcolepsy. When she is at rest he will often sleep and will sleep for extended periods of time but other times have difficulty sleeping. The patient also has been diagnosed with sleep apnea and uses a CPAP machine.      Diagnosis:    Axis I: Generalized anxiety disorder  Narcolepsy  Panic disorder with agoraphobia      Axis II: No diagnosis  Hershal CoriaRODENBOUGH,Adelena Desantiago R, PsyD 04/08/2014

## 2014-04-09 ENCOUNTER — Telehealth (HOSPITAL_COMMUNITY): Payer: Self-pay | Admitting: *Deleted

## 2014-04-09 NOTE — Telephone Encounter (Signed)
Pt calling stating that Dr. Kieth Brightlyodenbough told her to call if she needed a note written for her to not attend jury duty. Pt states she would like for Dr. Kieth Brightlyodenbough to write that note for her. Pt would like the note by Monday November 30,2015. Pt phone number is [phone]

## 2014-04-14 ENCOUNTER — Encounter (HOSPITAL_COMMUNITY): Payer: Self-pay | Admitting: Psychology

## 2014-04-14 ENCOUNTER — Telehealth (HOSPITAL_COMMUNITY): Payer: Self-pay | Admitting: *Deleted

## 2014-04-14 NOTE — Telephone Encounter (Signed)
lmtcb due to previous phone call. Pt need to come and pick up letter she requested from Dr. Kieth Brightlyodenbough. number provided.

## 2014-04-30 ENCOUNTER — Ambulatory Visit (INDEPENDENT_AMBULATORY_CARE_PROVIDER_SITE_OTHER): Payer: No Typology Code available for payment source | Admitting: Psychology

## 2014-04-30 ENCOUNTER — Encounter (HOSPITAL_COMMUNITY): Payer: Self-pay | Admitting: Psychology

## 2014-04-30 DIAGNOSIS — F411 Generalized anxiety disorder: Secondary | ICD-10-CM

## 2014-04-30 DIAGNOSIS — G47419 Narcolepsy without cataplexy: Secondary | ICD-10-CM

## 2014-04-30 DIAGNOSIS — F4001 Agoraphobia with panic disorder: Secondary | ICD-10-CM

## 2014-04-30 NOTE — Progress Notes (Signed)
      PROGRESS NOTE  Patient:  Shari Kennedy   DOB: [birth year]  MR Number: [id number]  [facility] [address] [place] [address] Dept: [phone]  Start: 11 AM End: 12 PM  Provider/Observer:     Hershal CoriaJohn R Rodenbough PSYD  Chief Complaint:      Chief Complaint  Patient presents with  . Anxiety  . Panic Attack    Reason For Service:     The patient was referred to Dr. Andrey CampanileWilson was admitted to the anxiety, depression and coping with the new narcolepsy. He has also been diagnosed with sleep apnea and uses a CPAP machine at night. The patient also works third shift. She describes significant anxiety resulting in sweating, worrying about others and constantly intrusive thoughts around financial situations. She reports that when she is not working or worrying that she will sleep too much and has no social life. The patient reports that she has significant anxiety in social situations and is unable to go to training at work because of anxiety. She reports that she's had these symptoms for at least the past 10 years. She reports she quit taking anxiety medication January 2015 and that her anxiety has gotten much worse.    Interventions Strategy:  Cognitive/behavioral psychotherapeutic interventions  Participation Level:   Active  Participation Quality:  Appropriate      Behavioral Observation:  Well Groomed, Drowsy, and Appropriate.   Current Psychosocial Factors: The patient reports that she was unable to perform Jury duty.  She did go to court house per recommendations, but started feeling like she was going to have panic attack and left.  She also did not go to two job interviews for fear she would have panic attack and mess it up.  Content of Session:   Reviewed her symptoms and continue to work on therapeutic interventions for issues related to depression, anxiety, and are  exacerbated by sleep disturbance including narcolepsy  Current Status:   The patient reports that while she is still fearful of having panic attacks etc, she actaully is noticing less frequent panic events.  Patient Progress:   Patient progress continues to be moderate in nature. As on the second time I saw her the patient has missed 2 appointments Dr. Derrek Guoversleeping her appointment times.  Target Goals:   Target goals include reducing intensity, severity, and duration of anxiety and depressive symptoms including feelings of helplessness and hopelessness, isolation, and reduced social interaction. There is also stated goal of trying to improve issues associated with her sleep disorder.  Last Reviewed:   04/30/2014  Goals Addressed Today:    Goals addressed today has to do with issues related to her disturbance in sleep pattern that are exacerbated by her narcolepsy and working on third shift.  Impression/Diagnosis:  The patient is long history of significant anxiety and depression but significant sleep disturbance. The patient works third shift and also has to do with what has been described as narcolepsy. When she is at rest he will often sleep and will sleep for extended periods of time but other times have difficulty sleeping. The patient also has been diagnosed with sleep apnea and uses a CPAP machine.      Diagnosis:    Axis I: Generalized anxiety disorder  Narcolepsy  Panic disorder with agoraphobia      Axis II: No diagnosis      RODENBOUGH,JOHN R, PsyD 04/30/2014

## 2014-05-30 ENCOUNTER — Telehealth (HOSPITAL_COMMUNITY): Payer: Self-pay | Admitting: *Deleted

## 2014-06-04 ENCOUNTER — Ambulatory Visit (HOSPITAL_COMMUNITY): Payer: Self-pay | Admitting: Psychology

## 2014-06-06 ENCOUNTER — Ambulatory Visit (HOSPITAL_COMMUNITY): Payer: Self-pay | Admitting: Psychiatry

## 2015-10-09 ENCOUNTER — Ambulatory Visit
Admit: 2015-10-09 | Discharge: 2015-10-09 | Payer: PRIVATE HEALTH INSURANCE | Attending: Neurology | Primary: Emergency Medicine

## 2015-10-09 DIAGNOSIS — G47411 Narcolepsy with cataplexy: Secondary | ICD-10-CM

## 2015-10-09 MED ORDER — AMPHETAMINE-DEXTROAMPHETAMINE 30 MG TAB
30 mg | ORAL_TABLET | ORAL | 0 refills | Status: DC
Start: 2015-10-09 — End: 2016-12-14

## 2015-10-09 NOTE — Progress Notes (Signed)
[facility] NEUROLOGY  [address]  [place], [place] [address]  Phone: [phone] Fax [phone]  Dr. Amador Cunas      10/09/2015  Cindy Short     Patient is referred by the following provider for consultation regarding as below:       I reviewed the available records and notes and have examined patient with the following findings:     Chief Complaint:  Chief Complaint   Patient presents with   ??? Follow-up          HPI: This is a   right handed 35 y.o. Single  female with a hx of Narcolepsy that was formal dx with a MSLT study after 3 sleep stiudies were done to tx her OSA and than and only than did they dx her with Narcolepsy but she had been treated since 2009.They made sure she did not have a false positive. She is on CPAP of 13 and doing well with it. PMP aware was looked at and is clear. She has no racing of her heart and tolerates meds great. She had no real response to Nuvigil or Provigil. Years since any cata[plexy events. Her xs sleepiness continuies but she is functional on Adderal 30 tid.    IMAGING REVIEW:  I REVIEWED PERTINENT  IMAGES AND REPORTS WITH THE PATIENT PERSONALLY, DIRECTLY AND FULLY.     Past Medical History:  History reviewed. No pertinent past medical history.    Past Surgical History:  History reviewed. No pertinent surgical history.    Social History:  Social History     Social History   ??? Marital status: UNKNOWN     Spouse name: N/A   ??? Number of children: N/A   ??? Years of education: N/A     Occupational History   ??? Not on file.     Social History Main Topics   ??? Smoking status: Not on file   ??? Smokeless tobacco: Not on file   ??? Alcohol use Not on file   ??? Drug use: Not on file   ??? Sexual activity: Not on file     Other Topics Concern   ??? Not on file     Social History Narrative   ??? No narrative on file       Family History:   History reviewed. No pertinent family history.    No current outpatient prescriptions on file prior to visit.      No current facility-administered medications on file prior to visit.        Not on File    Review of Systems:  ROS  No flowsheet data found.  No flowsheet data found.       Vitals:    10/09/15 0836   BP: (!) 132/93   Pulse: 93   SpO2: 97%   Weight: 275 lb (124.7 kg)        Physical Exam   Constitutional: She is oriented to person, place, and time and well-developed, well-nourished, and in no distress.   HENT:   Head: Normocephalic and atraumatic.   Eyes: Conjunctivae and EOM are normal. Pupils are equal, round, and reactive to light.   Neck: Normal range of motion.   Cardiovascular: Normal rate, regular rhythm and normal heart sounds.    Pulmonary/Chest: Effort normal.   Abdominal: Soft. She exhibits no mass. There is no tenderness. There is no guarding.   Musculoskeletal: Normal range of motion.   Neurological: She is alert and oriented  to person, place, and time. She has a normal Finger-Nose-Finger Test, a normal Heel to [employer], a normal Romberg Test and a normal Tandem Gait Test. Gait normal.   Skin: Skin is warm.   Psychiatric: Her speech is normal.       Neurologic Exam     Mental Status   Oriented to person, place, and time.   Registration: recalls 3 of 3 objects.   Attention: normal. Concentration: normal.   Speech: speech is normal   Level of consciousness: alert  Knowledge: good.     Cranial Nerves     CN II   Visual fields full to confrontation.     CN III, IV, VI   Pupils are equal, round, and reactive to light.  Extraocular motions are normal.   CN III: no CN III palsy  CN VI: no CN VI palsy  Nystagmus: none   Upgaze: normal  Downgaze: normal  Conjugate gaze: present    CN V   Facial sensation intact.     CN VII   Facial expression full, symmetric.     CN VIII   CN VIII normal.     CN IX, X   CN IX normal.     Motor Exam   Overall muscle tone: normal  Right arm tone: normal  Left arm tone: normal  Right arm pronator drift: absent  Right leg tone: normal       5/5 strength in the upper and lower ext      Sensory Exam   Right arm light touch: normal  Left arm light touch: normal  Right arm vibration: normal  Left arm vibration: normal  Right arm proprioception: normal  Left arm proprioception: normal  Pinprick normal.     Gait, Coordination, and Reflexes     Gait  Gait: normal    Coordination   Romberg: negative  Finger to nose coordination: normal  Heel to shin coordination: normal  Tandem walking coordination: normal    Tremor   Resting tremor: absent  Intention tremor: absent       DTR 2/4 in upper and lower ext.           Assessment   Assessment / Plan:    Cindy Short was seen today for follow-up.    Diagnoses and all orders for this visit:    Narcolepsy and cataplexy the patient has tried Nuvigil and Provigil and we have had her on Adderal 30 tid and doing great but cna not survive with out her meds. Dr. Thad Ranger stepped in in the intrim and i appretiate this.    Other orders  -     dextroamphetamine-amphetamine (ADDERALL) 30 mg tablet; tid  -     dextroamphetamine-amphetamine (ADDERALL) 30 mg tablet; Earliest Fill Date: 11/09/15.  tid  -     dextroamphetamine-amphetamine (ADDERALL) 30 mg tablet; Earliest Fill Date: 12/09/15.  tid      The Diagnosis and differential diagnostic considerations, and Rx Tx were reviewed with the patient at length.             Orders Placed This Encounter   ??? dextroamphetamine-amphetamine (ADDERALL) 30 mg tablet   ??? dextroamphetamine-amphetamine (ADDERALL) 30 mg tablet   ??? dextroamphetamine-amphetamine (ADDERALL) 30 mg tablet         I have spent greater than 50% of visit discussing and counseling of patient  for treatment and diagnostic plan review.      Notes: Patient is to continue all medications as directed by  prescribing physicians. Continuations on today's visit are made based on the patient's report of current medications.             Dr. Amador CunasJeff Zira Helinski  Consultation Neurology, Neurodiagnostics and Neurotherapeutics  Neuroelectrophysiology, EEG, EMG  [facility]   [address]  [place], [place] [address]  Phone:  [phone]  Fax:   [phone]

## 2015-12-18 NOTE — Telephone Encounter (Signed)
Pt called & wants to know if Dr. Sharlet Salina can mail her prescriptions to her for the next 3 months. I did advise pt that Dr. Sharlet Salina was out of the office today & all next week so it may be after that before Raynelle Fanning had an answer for her & she stated that was fine.

## 2015-12-22 NOTE — Telephone Encounter (Signed)
Please advise...

## 2015-12-29 MED ORDER — AMPHETAMINE-DEXTROAMPHETAMINE 30 MG TAB
30 mg | ORAL_TABLET | ORAL | 0 refills | Status: DC
Start: 2015-12-29 — End: 2016-12-14

## 2015-12-29 NOTE — Telephone Encounter (Signed)
done

## 2015-12-29 NOTE — Telephone Encounter (Signed)
Patient called asking if the prescription can be mailed. Please call her to let her know if it was mailed or if she needs to pick it up. It's okay to leave her a voicemail [phone]

## 2015-12-29 NOTE — Addendum Note (Signed)
Addended by: Sharlet SalinaBENJAMIN, Letroy Vazguez A on: 12/29/2015 07:02 AM      Modules accepted: Orders

## 2016-02-12 NOTE — Telephone Encounter (Signed)
NEEDS A REFILL ON ADDERALL [place] THRU [place]OV 2

## 2016-02-15 NOTE — Telephone Encounter (Signed)
[place] called and [place] and her medication,Adderall, will run out while she is gone and doesn't want to miss her trip. Pt would like to know what her options are? Please advise.

## 2016-02-16 MED ORDER — AMPHETAMINE-DEXTROAMPHETAMINE 30 MG TAB
30 mg | ORAL_TABLET | ORAL | 0 refills | Status: DC
Start: 2016-02-16 — End: 2016-12-14

## 2016-02-16 NOTE — Addendum Note (Signed)
Addended by: Sharlet SalinaBENJAMIN, Micahel Omlor A on: 02/16/2016 08:13 AM      Modules accepted: Orders

## 2016-02-16 NOTE — Telephone Encounter (Signed)
Patient called asking if the prescription can be written for Adderall 20 mg for one month because she won't be able to fill the 30mg  because it will be too soon. Please call her at [phone].

## 2016-02-18 NOTE — Telephone Encounter (Signed)
Pharmacy and pt aware.

## 2016-02-18 NOTE — Telephone Encounter (Signed)
Ok to do this please

## 2016-02-18 NOTE — Telephone Encounter (Signed)
Pt states that Dr Sharlet SalinaBenjamin need to  call the pharmacy  [facility] [address] to refill the Adderall on Oct. 15th

## 2016-04-11 ENCOUNTER — Encounter: Attending: Neurology | Primary: Emergency Medicine

## 2016-05-04 NOTE — Telephone Encounter (Signed)
Patient needs you to call and authorize that they can fill her Adderall early due to her leaving on a cruise.  [phone] [facility]

## 2016-05-04 NOTE — Telephone Encounter (Signed)
Called pharmacy and authorized early refill due to going on cruise on jan 6th.

## 2016-05-20 NOTE — Telephone Encounter (Signed)
[facility], written Rx you gave her in Oct is expired. Going on cruise tomorrow and needs it sent today

## 2016-06-15 ENCOUNTER — Ambulatory Visit
Admit: 2016-06-15 | Discharge: 2016-06-15 | Payer: BLUE CROSS/BLUE SHIELD | Attending: Neurology | Primary: Emergency Medicine

## 2016-06-15 DIAGNOSIS — G47411 Narcolepsy with cataplexy: Secondary | ICD-10-CM

## 2016-06-15 MED ORDER — AMPHETAMINE-DEXTROAMPHETAMINE 30 MG TAB
30 mg | ORAL_TABLET | Freq: Three times a day (TID) | ORAL | 0 refills | Status: DC | PRN
Start: 2016-06-15 — End: 2016-12-14

## 2016-06-15 NOTE — Progress Notes (Signed)
[facility]  [address]  [place], [place] [address]  Phone: [phone] Fax [phone]  Dr. Amador CunasJeff Brice Potteiger      06/15/2016  Jaymes GraffAshley Blackson     Patient is referred by the following provider for consultation regarding as below:       I reviewed the available records and notes and have examined patient with the following findings:     Chief Complaint:  No chief complaint on file.         HPI: This is a   right handed 36 y.o. 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  She is doing great with meds but if she misses a dose she has severe sleepiness of 18 hrs some times but with meds great even went on a cruise and enjoyed all aspects of it. PMP aware was looked at and clear with one rx that was needed bt priamary to get the patient through.    IMAGING REVIEW:  I REVIEWED PERTINENT  IMAGES AND REPORTS WITH THE PATIENT PERSONALLY, DIRECTLY AND FULLY.     Past Medical History:  History reviewed. No pertinent past medical history.    Past Surgical History:  History reviewed. No pertinent surgical history.    Social History:  Social History     Social History   ??? Marital status: UNKNOWN     Spouse name: N/A   ??? Number of children: N/A   ??? Years of education: N/A     Occupational History   ??? Not on file.     Social History Main Topics   ??? Smoking status: Never Smoker   ??? Smokeless tobacco: Never Used   ??? Alcohol use Not on file   ??? Drug use: Not on file   ??? Sexual activity: Not on file     Other Topics Concern   ??? Not on file     Social History Narrative       Family History:   History reviewed. No pertinent family history.    Current Outpatient Prescriptions on File Prior to Visit   Medication Sig Dispense Refill   ??? dextroamphetamine-amphetamine (ADDERALL) 30 mg tablet Take 30 mg by mouth three (3) times daily.     ??? sertraline (ZOLOFT) 100 mg tablet Take  by mouth daily.     ??? SITagliptin-metFORMIN (JANUMET) 50-500 mg per tablet Take 1 Tab by mouth  two (2) times daily (with meals).     ??? dextroamphetamine-amphetamine (ADDERALL) 30 mg tablet tid 90 Tab 0   ??? dextroamphetamine-amphetamine (ADDERALL) 30 mg tablet Earliest Fill Date: 03/18/16.  tid 90 Tab 0   ??? dextroamphetamine-amphetamine (ADDERALL) 30 mg tablet Earliest Fill Date: 04/17/16.  tid 90 Tab 0   ??? dextroamphetamine-amphetamine (ADDERALL) 30 mg tablet tid 90 Tab 0   ??? dextroamphetamine-amphetamine (ADDERALL) 30 mg tablet Earliest Fill Date: 01/29/16.  tid 90 Tab 0   ??? dextroamphetamine-amphetamine (ADDERALL) 30 mg tablet Earliest Fill Date: 02/28/16.  tid 90 Tab 0   ??? lisinopril (PRINIVIL, ZESTRIL) 5 mg tablet Take  by mouth daily.     ??? dextroamphetamine-amphetamine (ADDERALL) 30 mg tablet tid 90 Tab 0   ??? dextroamphetamine-amphetamine (ADDERALL) 30 mg tablet Earliest Fill Date: 11/09/15.  tid 90 Tab 0   ??? dextroamphetamine-amphetamine (ADDERALL) 30 mg tablet Earliest Fill Date: 12/09/15.  tid 90 Tab 0     No current facility-administered medications on file prior to visit.  Not on File    Review of Systems:  Review of Systems   Constitutional: Negative.    HENT: Negative.    Eyes: Negative.    Respiratory: Negative.    Cardiovascular: Negative.    Gastrointestinal: Negative.    Genitourinary: Negative.    Musculoskeletal: Negative.    Skin: Negative.    Endo/Heme/Allergies: Negative.      No flowsheet data found.  No flowsheet data found.       Vitals:    06/15/16 1143   BP: (!) 172/94   Pulse: 98   Resp: 16   SpO2: 98%   Weight: 303 lb 8 oz (137.7 kg)   Height: 5\' 3"  (1.6 m)        Physical Exam   Constitutional: She is oriented to person, place, and time and well-developed, well-nourished, and in no distress.   HENT:   Head: Normocephalic and atraumatic.   Eyes: EOM are normal. Pupils are equal, round, and reactive to light.   Cardiovascular: Normal rate and regular rhythm.    Pulmonary/Chest: Effort normal.   Abdominal: Soft.    Neurological: She is alert and oriented to person, place, and time. She has a normal Finger-Nose-Finger Test, a normal Heel to [employer], a normal Romberg Test and a normal Tandem Gait Test. Gait normal.   Skin: Skin is warm.   Psychiatric: Her speech is normal.       Neurologic Exam     Mental Status   Oriented to person, place, and time.   Registration: recalls 3 of 3 objects.   Attention: normal. Concentration: normal.   Speech: speech is normal   Level of consciousness: alert  Knowledge: good.     Cranial Nerves     CN II   Visual fields full to confrontation.     CN III, IV, VI   Pupils are equal, round, and reactive to light.  Extraocular motions are normal.   CN III: no CN III palsy  CN VI: no CN VI palsy  Nystagmus: none   Upgaze: normal  Downgaze: normal  Conjugate gaze: present    CN V   Facial sensation intact.     CN VII   Facial expression full, symmetric.     CN VIII   CN VIII normal.     CN IX, X   CN IX normal.     Motor Exam   Overall muscle tone: normal  Right arm tone: normal  Left arm tone: normal  Right arm pronator drift: absent  Right leg tone: normal       5/5 strength in the upper and lower ext     Sensory Exam   Right arm light touch: normal  Left arm light touch: normal  Right arm vibration: normal  Left arm vibration: normal  Right arm proprioception: normal  Left arm proprioception: normal  Pinprick normal.     Gait, Coordination, and Reflexes     Gait  Gait: normal    Coordination   Romberg: negative  Finger to nose coordination: normal  Heel to shin coordination: normal  Tandem walking coordination: normal    Tremor   Resting tremor: absent  Intention tremor: absent       DTR 2/4 in upper and lower ext.           Assessment   Assessment / Plan:    Diagnoses and all orders for this visit:    1. Narcolepsy and cataplexy doing well with Adderall  and PMP aware was looked at and all is clear.  -     dextroamphetamine-amphetamine (ADDERALL) 30 mg tablet; Take 1 Tab by  mouth every eight (8) hours as needed.  Max Daily Amount: 3 Tabs  -     dextroamphetamine-amphetamine (ADDERALL) 30 mg tablet; Take 1 Tab by mouth every eight (8) hours as neededEarliest Fill Date: 07/13/16.  Max Daily Amount: 3 Tabs  -     dextroamphetamine-amphetamine (ADDERALL) 30 mg tablet; Take 1 Tab by mouth every eight (8) hours as neededEarliest Fill Date: 08/13/16.  Max Daily Amount: 3 Tabs        The Diagnosis and differential diagnostic considerations, and Rx Tx were reviewed with the patient at length.             Orders Placed This Encounter   ??? dextroamphetamine-amphetamine (ADDERALL) 30 mg tablet   ??? dextroamphetamine-amphetamine (ADDERALL) 30 mg tablet   ??? dextroamphetamine-amphetamine (ADDERALL) 30 mg tablet         I have spent greater than 50% of visit discussing and counseling of patient  for treatment and diagnostic plan review.      Notes: Patient is to continue all medications as directed by prescribing physicians. Continuations on today's visit are made based on the patient's report of current medications.             Dr. Amador Cunas  Consultation Neurology, Neurodiagnostics and Neurotherapeutics  Neuroelectrophysiology, EEG, EMG  [facility]  [address]  [place], [place] [address]  Phone:  [phone]  Fax:   [phone]

## 2016-08-31 NOTE — Telephone Encounter (Signed)
Pt need refill on Adderall-30 mg, will not be able to come to next appointment need a call back [phone] lives in [place].

## 2016-09-01 NOTE — Telephone Encounter (Signed)
Dr. Benjamin,  Please advise.

## 2016-09-02 MED ORDER — AMPHETAMINE-DEXTROAMPHETAMINE 30 MG TAB
30 mg | ORAL_TABLET | ORAL | 0 refills | Status: DC
Start: 2016-09-02 — End: 2016-12-14

## 2016-09-02 NOTE — Addendum Note (Signed)
Addended by: Sharlet Salina, Kandas Oliveto A on: 09/02/2016 07:21 AM      Modules accepted: Orders

## 2016-09-08 NOTE — Telephone Encounter (Signed)
Dr. Sharlet Salina,  Please advise and print off scripts if OK.

## 2016-09-08 NOTE — Telephone Encounter (Signed)
These were printed and mailed. We can not do it again.  Double check to make sure they were

## 2016-09-08 NOTE — Telephone Encounter (Signed)
Message left informing patient

## 2016-09-08 NOTE — Telephone Encounter (Signed)
Pt will not be able to come in on 5-3, she will like to have the ADDERALL mailed to her

## 2016-09-12 NOTE — Telephone Encounter (Signed)
Call placed to patient.    Message left informing patient of Dr. Elly Modena response.  Awaiting response from patient.

## 2016-09-12 NOTE — Telephone Encounter (Signed)
Patient called stating that the Rx adderall was the incorrect amount, patient takes 3 a day.

## 2016-09-12 NOTE — Telephone Encounter (Signed)
Ok she can mail back or use what she has?? Ask her what she wants to do??

## 2016-09-13 NOTE — Telephone Encounter (Signed)
Message left for patient informing her that once the script is received here we will forward the nes script.    Will close  this encounter.

## 2016-09-13 NOTE — Telephone Encounter (Signed)
Patient called stating she overnighted the incorrect prescription today and asked for the correct prescription for tid be sent to her.

## 2016-09-14 NOTE — Telephone Encounter (Signed)
Mail received from patient.      Patient sent back script for Adderrall as the sig was incorrect.    Patient reports needing new scripts x 3 for ADDERALL 30MG  1 TAB TID #90.    Dr. Sharlet SalinaBenjamin,  Incorrect script received and placed in your "green folder" for crrection and new scripts to be mailed back to patient.

## 2016-09-15 ENCOUNTER — Encounter: Attending: Neurology | Primary: Emergency Medicine

## 2016-09-15 MED ORDER — AMPHETAMINE-DEXTROAMPHETAMINE 30 MG TAB
30 mg | ORAL_TABLET | Freq: Three times a day (TID) | ORAL | 0 refills | Status: DC
Start: 2016-09-15 — End: 2016-12-14

## 2016-09-15 NOTE — Telephone Encounter (Signed)
Call placed to patient    Message left informing patient that incorrect scripts for patient's Adderall were received and that Dr. Sharlet SalinaBenjamin has re ordered the correct dose/sig.  Patient made aware that new scripts were placed in the outgoing mail today and should be received by her tomorrow or this weekend.    Will close this case.

## 2016-09-15 NOTE — Addendum Note (Signed)
Addended by: Amador CunasBENJAMIN, Abbe Bula A on: 09/15/2016 08:37 AM      Modules accepted: Orders

## 2016-12-14 ENCOUNTER — Ambulatory Visit
Admit: 2016-12-14 | Discharge: 2016-12-14 | Payer: BLUE CROSS/BLUE SHIELD | Attending: Neurology | Primary: Emergency Medicine

## 2016-12-14 DIAGNOSIS — G47411 Narcolepsy with cataplexy: Secondary | ICD-10-CM

## 2016-12-14 MED ORDER — AMPHETAMINE-DEXTROAMPHETAMINE 30 MG TAB
30 mg | ORAL_TABLET | ORAL | 0 refills | Status: DC
Start: 2016-12-14 — End: 2017-03-01

## 2016-12-14 NOTE — Progress Notes (Signed)
[facility]  [address]  [place], [place] [address]  Phone: [phone] Fax [phone]  Dr. Amador Cunas      12/14/2016  Cindy Short     Patient is referred by the following provider for consultation regarding as below:       I reviewed the available records and notes and have examined patient with the following findings:     Chief Complaint:  Chief Complaint   Patient presents with   ??? Follow-up          HPI: This is a   right handed 36 y.o.   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 No question I think Gastric sleeve may be of significan thelp in her mediucal future. She is stable with adderall 30 tid for the narcolepsy and with out it she sleeps 18 hrs straight and can still sleep more. She has OSA and she want to proceed with gastric sleeve. She is pleasant and doing well.    IMAGING REVIEW:  I REVIEWED PERTINENT  IMAGES AND REPORTS WITH THE PATIENT PERSONALLY, DIRECTLY AND FULLY.     Past Medical History:  History reviewed. No pertinent past medical history.    Past Surgical History:  History reviewed. No pertinent surgical history.    Social History:  Social History     Social History   ??? Marital status: UNKNOWN     Spouse name: N/A   ??? Number of children: N/A   ??? Years of education: N/A     Occupational History   ??? Not on file.     Social History Main Topics   ??? Smoking status: Never Smoker   ??? Smokeless tobacco: Never Used   ??? Alcohol use Not on file   ??? Drug use: Not on file   ??? Sexual activity: Not on file     Other Topics Concern   ??? Not on file     Social History Narrative       Family History:   History reviewed. No pertinent family history.    Current Outpatient Prescriptions on File Prior to Visit   Medication Sig Dispense Refill   ??? lisinopril (PRINIVIL, ZESTRIL) 10 mg tablet TAKE 1 TABLET BY MOUTH EVERY DAY  1   ??? sertraline (ZOLOFT) 100 mg tablet Take  by mouth daily.     ??? SITagliptin-metFORMIN (JANUMET) 50-500 mg per tablet Take 1 Tab by mouth  two (2) times daily (with meals).     ??? lisinopril (PRINIVIL, ZESTRIL) 5 mg tablet Take  by mouth daily.       No current facility-administered medications on file prior to visit.        No Known Allergies    Review of Systems:  Review of Systems   Constitutional: Negative.    HENT: Negative.    Eyes: Negative.    Cardiovascular: Negative.    Gastrointestinal: Negative.    Genitourinary: Negative.    Skin: Negative.    Neurological: Positive for headaches.     No flowsheet data found.  No flowsheet data found.       Vitals:    12/14/16 1316   Height: 5\' 3"  (1.6 m)        Physical Exam   Constitutional: She is oriented to person, place, and time and well-developed, well-nourished, and in no distress.   HENT:   Head: Normocephalic and atraumatic.   Eyes: EOM are normal. Pupils are equal, round,  and reactive to light.   Cardiovascular: Normal rate.    Pulmonary/Chest: Effort normal.   Abdominal: Soft.   Neurological: She is alert and oriented to person, place, and time. Gait normal.   Reflex Scores:       Tricep reflexes are 2+ on the right side and 2+ on the left side.       Bicep reflexes are 2+ on the right side and 2+ on the left side.       Brachioradialis reflexes are 2+ on the right side and 2+ on the left side.       Patellar reflexes are 2+ on the right side and 2+ on the left side.       Achilles reflexes are 2+ on the right side and 2+ on the left side.  Skin: Skin is warm.       Neurologic Exam     Mental Status   Oriented to person, place, and time.   Attention: normal. Concentration: normal.   Level of consciousness: alert  Knowledge: good.     Cranial Nerves   Cranial nerves II through XII intact.     CN III, IV, VI   Pupils are equal, round, and reactive to light.  Extraocular motions are normal.     Motor Exam   Muscle bulk: normal  Overall muscle tone: normal  Right arm tone: normal  Left arm tone: normal  Right leg tone: normal  Left leg tone: normal    Gait, Coordination, and Reflexes     Gait   Gait: normal    Reflexes   Right brachioradialis: 2+  Left brachioradialis: 2+  Right biceps: 2+  Left biceps: 2+  Right triceps: 2+  Left triceps: 2+  Right patellar: 2+  Left patellar: 2+  Right achilles: 2+  Left achilles: 2+          Assessment   Assessment / Plan:    Diagnoses and all orders for this visit:    1. Narcolepsy and cataplexy pmp aware is ok and she is still doing well.   -     dextroamphetamine-amphetamine (ADDERALL) 30 mg tablet; Take one tid  -     dextroamphetamine-amphetamine (ADDERALL) 30 mg tablet; Earliest Fill Date: 01/14/17.  Take one tid  -     dextroamphetamine-amphetamine (ADDERALL) 30 mg tablet; Earliest Fill Date: 02/13/17.  Take one tid    2. Attention deficit disorder, unspecified hyperactivity presence  -     dextroamphetamine-amphetamine (ADDERALL) 30 mg tablet; Take one tid  -     dextroamphetamine-amphetamine (ADDERALL) 30 mg tablet; Earliest Fill Date: 01/14/17.  Take one tid  -     dextroamphetamine-amphetamine (ADDERALL) 30 mg tablet; Earliest Fill Date: 02/13/17.  Take one tid    3. Obesity with serious comorbidity, unspecified classification, unspecified obesity type I agree with Gastric sleeve.        The Diagnosis and differential diagnostic considerations, and Rx Tx were reviewed with the patient at length.             Orders Placed This Encounter   ??? dextroamphetamine-amphetamine (ADDERALL) 30 mg tablet   ??? dextroamphetamine-amphetamine (ADDERALL) 30 mg tablet   ??? dextroamphetamine-amphetamine (ADDERALL) 30 mg tablet         I have spent greater than 50% of visit discussing and counseling of patient  for treatment and diagnostic plan review.      Notes: Patient is to continue all medications as directed by prescribing physicians. Continuations on today's  visit are made based on the patient's report of current medications.             Dr. Amador CunasJeff Hillel Card  Consultation Neurology, Neurodiagnostics and Neurotherapeutics  Neuroelectrophysiology, EEG, EMG  [facility]   [address]  [place], [place] [address]  Phone:  [phone]  Fax:   [phone]

## 2017-03-01 ENCOUNTER — Encounter

## 2017-03-01 ENCOUNTER — Encounter: Admit: 2017-03-01 | Payer: BLUE CROSS/BLUE SHIELD | Attending: Neurology | Primary: Emergency Medicine

## 2017-03-01 MED ORDER — AMPHETAMINE-DEXTROAMPHETAMINE 30 MG TAB
30 mg | ORAL_TABLET | ORAL | 0 refills | Status: AC
Start: 2017-03-01 — End: ?

## 2017-03-17 NOTE — Telephone Encounter (Signed)
Pt called asking to be referred for her narcolepsy and sleep apnea [place]. MD [phone] [phone] for referral

## 2017-03-20 NOTE — Telephone Encounter (Signed)
done

## 2017-03-23 ENCOUNTER — Telehealth

## 2017-03-23 NOTE — Telephone Encounter (Signed)
Referral to [facility]/ Dr. Wandra Scotobert Verona.

## 2017-03-23 NOTE — Telephone Encounter (Signed)
Placing referral to Sleep medicine.    Will close this encounter.

## 2018-09-20 ENCOUNTER — Encounter (HOSPITAL_COMMUNITY): Payer: Self-pay

## 2018-09-20 ENCOUNTER — Other Ambulatory Visit: Payer: Self-pay

## 2018-09-20 ENCOUNTER — Emergency Department (HOSPITAL_COMMUNITY)
Admission: EM | Admit: 2018-09-20 | Discharge: 2018-09-20 | Disposition: A | Discharge status: Home / Self Care | Payer: BLUE CROSS/BLUE SHIELD | Attending: Emergency Medicine | Admitting: Emergency Medicine

## 2018-09-20 DIAGNOSIS — Y929 Unspecified place or not applicable: Secondary | ICD-10-CM | POA: Insufficient documentation

## 2018-09-20 DIAGNOSIS — Y999 Unspecified external cause status: Secondary | ICD-10-CM | POA: Diagnosis not present

## 2018-09-20 DIAGNOSIS — S4991XA Unspecified injury of right shoulder and upper arm, initial encounter: Secondary | ICD-10-CM | POA: Diagnosis present

## 2018-09-20 DIAGNOSIS — Z7984 Long term (current) use of oral hypoglycemic drugs: Secondary | ICD-10-CM | POA: Insufficient documentation

## 2018-09-20 DIAGNOSIS — Z87891 Personal history of nicotine dependence: Secondary | ICD-10-CM | POA: Diagnosis not present

## 2018-09-20 DIAGNOSIS — X500XXA Overexertion from strenuous movement or load, initial encounter: Secondary | ICD-10-CM | POA: Insufficient documentation

## 2018-09-20 DIAGNOSIS — Z79899 Other long term (current) drug therapy: Secondary | ICD-10-CM | POA: Diagnosis not present

## 2018-09-20 DIAGNOSIS — Y939 Activity, unspecified: Secondary | ICD-10-CM | POA: Insufficient documentation

## 2018-09-20 DIAGNOSIS — S46911A Strain of unspecified muscle, fascia and tendon at shoulder and upper arm level, right arm, initial encounter: Secondary | ICD-10-CM

## 2018-09-20 MED ORDER — DIAZEPAM 5 MG PO TABS
10.0000 mg | ORAL_TABLET | Freq: Once | ORAL | Status: DC
  Filled 2018-09-20: qty 2

## 2018-09-20 MED ORDER — DIAZEPAM 5 MG PO TABS
5.0000 mg | ORAL_TABLET | Freq: Once | ORAL | Status: AC
  Administered 2018-09-20: 5 mg via ORAL

## 2018-09-20 MED ORDER — HYDROCODONE-ACETAMINOPHEN 5-325 MG PO TABS: 2.0000 | ORAL_TABLET | Freq: Four times a day (QID) | ORAL | 0 refills | Status: AC | PRN

## 2018-09-20 MED ORDER — ACETAMINOPHEN 500 MG PO TABS
1000.0000 mg | ORAL_TABLET | Freq: Once | ORAL | Status: DC
  Filled 2018-09-20: qty 2

## 2018-09-20 MED ORDER — HYDROCODONE-ACETAMINOPHEN 5-325 MG PO TABS
1.0000 | ORAL_TABLET | Freq: Once | ORAL | Status: AC
  Administered 2018-09-20: 1 via ORAL
  Filled 2018-09-20: qty 1

## 2018-09-20 NOTE — Discharge Instructions (Addendum)
Use ice and Tylenol every 4 hours to help with pain and inflammation. See local orthopedic for further treatment options if no improvement next week. For severe pain take norco or vicodin however realize they have the potential for addiction and it can make you sleepy and has tylenol in it.  No operating machinery while taking.

## 2018-09-20 NOTE — ED Provider Notes (Signed)
[facility] EMERGENCY DEPARTMENT Provider Note   CSN: [id number] Arrival date & time: 09/20/18  4709    History   Chief Complaint Chief Complaint  Patient presents with  . Shoulder Pain    right    HPI Shari Kennedy is a 38 y.o. female.     Patient with history of anxiety and depression presents with right shoulder pain beginning yesterday.  Patient multiple instances where she was lifting her heavy dogs in awkward positions and feels likely that caused it.  No direct trauma or falls.  Patient has no history of shoulder problems.  No other injuries.  Patient denies any weakness or numbness.  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  Patient denies any current abdominal pain or fevers, patient does still have her gallbladder.     Past Medical History:  Diagnosis Date  . Anxiety   . Depression   . Narcolepsy   . Sleep apnea    CPAP EVERY NIGHT    There are no active problems to display for this patient.   Past Surgical History:  Procedure Laterality Date  . ADENOIDECTOMY    . bowel obstruction    . BREAST REDUCTION SURGERY  07/28/2011   Procedure: MAMMARY REDUCTION  (BREAST);  Surgeon: Etter Sjogren, MD;  Location: [facility];  Service: Plastics;  Laterality: Right;  Right Breast Reduction  . BREAST SURGERY     reduction  . DILATION AND CURETTAGE, DIAGNOSTIC / THERAPEUTIC    . GASTRIC BYPASS    . PILONIDAL CYST EXCISION       OB History   No obstetric history on file.      Home Medications    Prior to Admission medications   Medication Sig Start Date End Date Taking? Authorizing Provider  Acetaminophen 500 MG coapsule Take 1,000 mg by mouth every 6 (six) hours as needed. Fever reducer    [provider]  amphetamine-dextroamphetamine (ADDERALL) 30 MG tablet Take 30 mg by mouth daily.    [provider]  cefTRIAXone (ROCEPHIN) 1 G injection Inject 1 g into the muscle once.     [provider]  cephALEXin (KEFLEX) 500 MG capsule Take 500 mg by mouth 3 (three) times daily. Take for 10 days, prescription filled on 09/12/11    [provider]  clonazePAM (KLONOPIN) 0.5 MG tablet Take 0.5 mg by mouth 2 (two) times daily as needed. Anxiety    [provider]  HYDROcodone-acetaminophen (NORCO) 5-325 MG tablet Take 2 tablets by mouth every 6 (six) hours as needed for up to 5 doses. 09/20/18   Blane Ohara, MD  metFORMIN (GLUMETZA) 500 MG (MOD) 24 hr tablet Take 500 mg by mouth daily with breakfast.    [provider]  modafinil (PROVIGIL) 200 MG tablet Take 200 mg by mouth daily.     [provider]  sertraline (ZOLOFT) 100 MG tablet Take 200 mg by mouth 2 (two) times daily.     [provider]    Family History Family History  Problem Relation Age of Onset  . Hypertension Maternal Grandmother   . Heart disease Maternal Grandmother   . Diabetes Mellitus I Maternal Grandfather   . CVA Paternal Grandmother   . Cancer Paternal Grandfather     Social History Social History   Tobacco Use  . Smoking status: Former Smoker    Packs/day: 0.50    Last attempt to quit: 05/27/2011  Years since quitting: 7.3  . Smokeless tobacco: Never Used  Substance Use Topics  . Alcohol use: No  . Drug use: No     Allergies   Doxycycline   Review of Systems Review of Systems  Constitutional: Negative for chills and fever.  Respiratory: Negative for shortness of breath.   Cardiovascular: Negative for chest pain.  Gastrointestinal: Negative for abdominal pain and vomiting.  Genitourinary: Negative for flank pain.  Musculoskeletal: Negative for back pain.  Neurological: Negative for headaches.     Physical Exam Updated Vital Signs LMP 08/21/2018   Physical Exam Vitals signs and nursing note reviewed.  Constitutional:      Appearance: She is well-developed.  HENT:     Head: Normocephalic and atraumatic.  Eyes:      General:        Right eye: No discharge.        Left eye: No discharge.  Neck:     Musculoskeletal: Normal range of motion.     Trachea: No tracheal deviation.  Cardiovascular:     Rate and Rhythm: Normal rate.  Pulmonary:     Effort: Pulmonary effort is normal.  Abdominal:     General: There is no distension.     Palpations: Abdomen is soft.     Tenderness: There is no abdominal tenderness. There is no guarding.  Musculoskeletal:     Comments: Patient has tenderness to palpation of posterior right shoulder.  No warmth or effusion.  Patient has mild tenderness with external rotation and empty can test.  Normal strength 5+ with abduction external rotation flexion and internal rotation.  Skin:    General: Skin is warm.     Findings: No rash.  Neurological:     Mental Status: She is alert and oriented to person, place, and time.      ED Treatments / Results  Labs (all labs ordered are listed, but only abnormal results are displayed) Labs Reviewed - No data to display  EKG None  Radiology No results found.  Procedures Procedures (including critical care time)  Medications Ordered in ED Medications  HYDROcodone-acetaminophen (NORCO/VICODIN) 5-325 MG per tablet 1 tablet (1 tablet Oral Given 09/20/18 0934)  diazepam (VALIUM) tablet 5 mg (5 mg Oral Given 09/20/18 0934)     Initial Impression / Assessment and Plan / ED Course  I have reviewed the triage vital signs and the nursing notes.  Pertinent labs & imaging results that were available during my care of the patient were reviewed by me and considered in my medical decision making (see chart for details).       Patient presents with acute shoulder strain possibly rotator related.  Discussed plan and treatment options for this inflammatory injury.  Patient unable to take NSAIDs so stressed ice regularly and Tylenol.  A few doses of narcotic will be given for when pain is more severe.  Pain medicine/muscle relaxant given in  the ER patient has a ride home.  No indication for x-ray at this time.    Final Clinical Impressions(s) / ED Diagnoses   Final diagnoses:  Right shoulder strain, initial encounter    ED Discharge Orders         Ordered    HYDROcodone-acetaminophen (NORCO) 5-325 MG tablet  Every 6 hours PRN     09/20/18 0948           Blane OharaZavitz, Nethan Caudillo, MD 09/20/18 [phone]

## 2018-09-20 NOTE — ED Triage Notes (Signed)
Patient presents with right shoulder pain beginning yesterday, worsening over the night.  Patient has hx of gastric bypass unable to take any medications other than tylenol for pain.  Patient states she has broke up a fight with her dogs yesterday possible causing this pain.
